# Patient Record
Sex: Male | Born: 1955 | Race: White | Hispanic: No | Marital: Married | Smoking: Never smoker
Health system: Southern US, Community
[De-identification: ages and names within clinical notes are randomized; demographics above are authoritative.]

## PROBLEM LIST (undated history)

## (undated) DIAGNOSIS — N400 Enlarged prostate without lower urinary tract symptoms: Secondary | ICD-10-CM

## (undated) DIAGNOSIS — E785 Hyperlipidemia, unspecified: Secondary | ICD-10-CM

## (undated) DIAGNOSIS — E669 Obesity, unspecified: Secondary | ICD-10-CM

## (undated) DIAGNOSIS — R972 Elevated prostate specific antigen [PSA]: Secondary | ICD-10-CM

## (undated) DIAGNOSIS — N401 Enlarged prostate with lower urinary tract symptoms: Secondary | ICD-10-CM

## (undated) HISTORY — DX: Hyperlipidemia, unspecified: E78.5

## (undated) HISTORY — PX: KIDNEY STONE SURGERY: SHX686

## (undated) HISTORY — DX: Obesity, unspecified: E66.9

## (undated) HISTORY — PX: CHOLECYSTECTOMY: SHX55

## (undated) HISTORY — PX: VASECTOMY: SHX75

## (undated) HISTORY — DX: Elevated prostate specific antigen (PSA): R97.20

## (undated) HISTORY — PX: TONSILLECTOMY: SHX5217

---

## 2010-08-11 ENCOUNTER — Ambulatory Visit
Admission: RE | Admit: 2010-08-11 | Discharge: 2010-08-11 | Payer: Self-pay | Source: Home / Self Care | Attending: Family Medicine | Admitting: Family Medicine

## 2010-08-11 ENCOUNTER — Encounter (INDEPENDENT_AMBULATORY_CARE_PROVIDER_SITE_OTHER): Payer: Self-pay | Admitting: *Deleted

## 2010-08-11 ENCOUNTER — Other Ambulatory Visit: Payer: Self-pay | Admitting: Family Medicine

## 2010-08-11 DIAGNOSIS — N400 Enlarged prostate without lower urinary tract symptoms: Secondary | ICD-10-CM | POA: Insufficient documentation

## 2010-08-11 DIAGNOSIS — N401 Enlarged prostate with lower urinary tract symptoms: Secondary | ICD-10-CM | POA: Insufficient documentation

## 2010-08-11 LAB — BASIC METABOLIC PANEL
BUN: 16 mg/dL (ref 6–23)
CO2: 29 mEq/L (ref 19–32)
Calcium: 9.4 mg/dL (ref 8.4–10.5)
Chloride: 102 mEq/L (ref 96–112)
Creatinine, Ser: 0.9 mg/dL (ref 0.4–1.5)
GFR: 99.13 mL/min (ref >60.00)
Glucose, Bld: 89 mg/dL (ref 70–99)
Potassium: 5.2 mEq/L — ABNORMAL HIGH (ref 3.5–5.1)
Sodium: 139 mEq/L (ref 135–145)

## 2010-08-11 LAB — LIPID PANEL
Cholesterol: 155 mg/dL (ref 0–200)
HDL: 43.6 mg/dL (ref >39.00)
LDL Cholesterol: 98 mg/dL (ref 0–99)
Total CHOL/HDL Ratio: 4
Triglycerides: 65 mg/dL (ref 0.0–149.0)
VLDL: 13 mg/dL (ref 0.0–40.0)

## 2010-08-11 LAB — PSA: PSA: 2.06 ng/mL (ref 0.10–4.00)

## 2010-08-27 ENCOUNTER — Encounter (INDEPENDENT_AMBULATORY_CARE_PROVIDER_SITE_OTHER): Payer: Self-pay | Admitting: *Deleted

## 2010-08-27 NOTE — Letter (Signed)
Summary: Pre Visit Letter Revised  [facility]  [address] [place], [place] [address]   Phone: [phone]  Fax: [phone]        08/11/2010 MRN: [id number] Riley Peterson [address] [place], [place]  [address]             Procedure Date:  September 14, 2010   dir col- Dr Jarold Motto  Welcome to the Gastroenterology Division at [facility].    You are scheduled to see a nurse for your pre-procedure visit on August 31, 2010 at 11:00am on the 3rd floor at [facility], [address]  We ask that you try to arrive at our office 15 minutes prior to your appointment time to allow for check-in.  Please take a minute to review the attached form.  If you answer "Yes" to one or more of the questions on the first page, we ask that you call the person listed at your earliest opportunity.  If you answer "No" to all of the questions, please complete the rest of the form and bring it to your appointment.    Your nurse visit will consist of discussing your medical and surgical history, your immediate family medical history, and your medications.   If you are unable to list all of your medications on the form, please bring the medication bottles to your appointment and we will list them.  We will need to be aware of both prescribed and over the counter drugs.  We will need to know exact dosage information as well.    Please be prepared to read and sign documents such as consent forms, a financial agreement, and acknowledgement forms.  If necessary, and with your consent, a friend or relative is welcome to sit-in on the nurse visit with you.  Please bring your insurance card so that we may make a copy of it.  If your insurance requires a referral to see a specialist, please bring your referral form from your primary care physician.  No co-pay is required for this nurse visit.     If you cannot keep your appointment, please call [phone] to cancel or reschedule  prior to your appointment date.  This allows [place] the opportunity to schedule an appointment for another patient in need of care.    Thank you for choosing [facility] for your medical needs.  We appreciate the opportunity to care for you.  Please visit [place] at our website  to learn more about our practice.  Sincerely, The Gastroenterology Division

## 2010-08-27 NOTE — Assessment & Plan Note (Signed)
Summary: NEW PT TO EST/CLE   Vital Signs:  Patient profile:   55 year old male Height:      73.50 inches Weight:      259.50 pounds BMI:     33.89 Temp:     98.3 degrees F oral Pulse rate:   80 / minute Pulse rhythm:   regular BP sitting:   120 / 80  (left arm) Cuff size:   large  Vitals Entered By: Linde Gillis CMA Duncan Dull) (August 11, 2010 9:50 AM) CC: new patient, establish care   History of Present Illness: 55 yo here to establish care.    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  Obesity- chronic struggle.  Was a member of [employer] in [place]. [place]. Lost 120 ouns on their program in 2008.  Since moving here, had gained quite a bit of his weight back but he is back on track.  Already has lost over 20 pounds since beginning of this month. Also loves to ride his bike.  H/o colon polyps- due for colonoscopy.  Not having any changes in his bowel habits or blood in his stool.  BPH- feels like his symptoms are progressing.  Having difficulty starting and maintaining his stream.  Also increased noctura.  No family history of prostate CA.  Preventive Screening-Counseling & Management  Alcohol-Tobacco     Smoking Status: never  Caffeine-Diet-Exercise     Does Patient Exercise: yes  Allergies (verified): 1)  ! Penicillin  Past History:  Past Medical History: Obesity- compulsive overeater  Past Surgical History: Cholecystectomy Tonsillectomy  Family History: Mom died of stroke in her 106s, DM  h/o colon polyps  Social History: Married to [employer] Never Smoked Alcohol use-yes Regular exercise-yes Pt is a Education officer, environmental Smoking Status:  never Does Patient Exercise:  yes  Review of Systems      See HPI General:  Denies malaise. Eyes:  Denies blurring. ENT:  Denies difficulty swallowing. CV:  Denies chest pain or discomfort. Resp:  Denies shortness of breath. GI:  Denies abdominal pain, bloody stools, and change in bowel habits. GU:   Complains of nocturia, urinary frequency, and urinary hesitancy; denies discharge, dysuria, and incontinence. Derm:  Denies rash. Neuro:  Denies headaches. Psych:  Denies anxiety and depression. Endo:  Denies cold intolerance and heat intolerance. Heme:  Denies abnormal bruising and bleeding.  Physical Exam  General:  alert and overweight-appearing.   Head:  normocephalic and atraumatic.   Eyes:  vision grossly intact and pupils equal.   Ears:  R ear normal and L ear normal.   Nose:  no external deformity.   Mouth:  good dentition.   Neck:  No deformities, masses, or tenderness noted. Lungs:  Normal respiratory effort, chest expands symmetrically. Lungs are clear to auscultation, no crackles or wheezes. Heart:  Normal rate and regular rhythm. S1 and S2 normal without gallop, murmur, click, rub or other extra sounds. Abdomen:  Bowel sounds positive,abdomen soft and non-tender without masses, organomegaly or hernias noted. Rectal:  No external abnormalities noted. Normal sphincter tone. No rectal masses or tenderness. Prostate:  no nodules, no asymmetry, and 1+ enlarged.   Msk:  No deformity or scoliosis noted of thoracic or lumbar spine.   Extremities:  No clubbing, cyanosis, edema, or deformity noted with normal full range of motion of all joints.   Neurologic:  No cranial nerve deficits noted. Station and gait are normal. Plantar reflexes are down-going bilaterally. DTRs are symmetrical throughout. Sensory,  motor and coordinative functions appear intact. Skin:  Intact without suspicious lesions or rashes Psych:  Cognition and judgment appear intact. Alert and cooperative with normal attention span and concentration. 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 He will think about it. Venipuncture (87564)  Problem # 2:  MORBID OBESITY (ICD-278.01) Assessment: Unchanged Time spent with patient 45 minutes, more than 50% of this time was spent discussing his issues with overeating and his eating plan. Has wonderful insight and very motivated to stay on track.   Other Orders: TLB-BMP (Basic Metabolic Panel-BMET) (80048-METABOL) TLB-Lipid Panel (80061-LIPID) TLB-PSA (Prostate Specific Antigen) (84153-PSA) Gastroenterology Referral (GI)  Patient Instructions: 1)  Great to meet you. 2)  Please stop by to see Shirlee Limerick on your way out.   Orders Added: 1)  Venipuncture [36415] 2)  TLB-BMP (Basic Metabolic Panel-BMET) [80048-METABOL] 3)  TLB-Lipid Panel [80061-LIPID] 4)  TLB-PSA (Prostate Specific Antigen) [33295-JOA] 5)  Gastroenterology Referral [GI] 6)  New Patient Level IV [41660]    Prior Medications (reviewed today): None Current Allergies (reviewed today): ! PENICILLIN  Colonoscopy Result Date:  08/11/2005 Colonoscopy Result:  historical-  Colonoscopy Next Due:  5 yr Hemoccult Result Date:  08/11/2010

## 2010-09-02 ENCOUNTER — Encounter: Payer: Self-pay | Admitting: Gastroenterology

## 2010-09-02 NOTE — Miscellaneous (Signed)
Summary: ROI  ROI   Imported By: Kassie Mends 08/28/2010 08:44:11  _____________________________________________________________________  External Attachment:    Type:   Image     Comment:   External Document

## 2010-09-08 ENCOUNTER — Encounter: Payer: Self-pay | Admitting: Family Medicine

## 2010-09-10 NOTE — Miscellaneous (Signed)
Summary: [facility] previsit...  Clinical Lists Changes  Medications: Added new medication of MOVIPREP 100 GM  SOLR (PEG-KCL-NACL-NASULF-NA ASC-C) As per prep instructions. - Signed Rx of MOVIPREP 100 GM  SOLR (PEG-KCL-NACL-NASULF-NA ASC-C) As per prep instructions.;  #1 x 0;  Signed;  Entered by: Karl Bales RN;  Authorized by: Mardella Layman MD [facility];  Method used: Print then Give to Patient    Prescriptions: MOVIPREP 100 GM  SOLR (PEG-KCL-NACL-NASULF-NA ASC-C) As per prep instructions.  #1 x 0   Entered by:   Karl Bales RN   Authorized by:   Mardella Layman MD [facility]   Signed by:   Karl Bales RN on 09/02/2010   Method used:   Print then Give to Patient   RxID:   [id number]

## 2010-09-10 NOTE — Letter (Signed)
Summary: Moviprep Instructions  [facility]  [address]   [place], [place] [address]   Phone: [phone]  Fax: [phone]       [name]    1955-09-19    MRN: [id number]        Procedure Day /Date: Monday, 09-14-10     Arrival Time: 9:30 a.m.     Procedure Time: 10:30 a.m.     Location of Procedure:                    x  [facility] (4th Floor)   PREPARATION FOR COLONOSCOPY WITH MOVIPREP   Starting 5 days prior to your procedure 09-09-10 do not eat nuts, seeds, popcorn, corn, beans, peas,  salads, or any raw vegetables.  Do not take any fiber supplements (e.g. Metamucil, Citrucel, and Benefiber).  THE DAY BEFORE YOUR PROCEDURE         DATE: 09-13-10  DAY: Sunday  1.  Drink clear liquids the entire day-NO SOLID FOOD  2.  Do not drink anything colored red or purple.  Avoid juices with pulp.  No orange juice.  3.  Drink at least 64 oz. (8 glasses) of fluid/clear liquids during the day to prevent dehydration and help the prep work efficiently.  CLEAR LIQUIDS INCLUDE: Water Jello Ice Popsicles Tea (sugar ok, no milk/cream) Powdered fruit flavored drinks Coffee (sugar ok, no milk/cream) Gatorade Juice: apple, white grape, white cranberry  Lemonade Clear bullion, consomm, broth Carbonated beverages (any kind) Strained chicken noodle soup Hard Candy                             4.  In the morning, mix first dose of MoviPrep solution:    Empty 1 Pouch A and 1 Pouch B into the disposable container    Add lukewarm drinking water to the top line of the container. Mix to dissolve    Refrigerate (mixed solution should be used within 24 hrs)  5.  Begin drinking the prep at 5:00 p.m. The MoviPrep container is divided by 4 marks.   Every 15 minutes drink the solution down to the next mark (approximately 8 oz) until the full liter is complete.   6.  Follow completed prep with 16 oz of clear liquid of your choice (Nothing red or purple).   Continue to drink clear liquids until bedtime.  7.  Before going to bed, mix second dose of MoviPrep solution:    Empty 1 Pouch A and 1 Pouch B into the disposable container    Add lukewarm drinking water to the top line of the container. Mix to dissolve    Refrigerate  THE DAY OF YOUR PROCEDURE      DATE: 09-14-10  DAY: Monday  Beginning at 5:30 a.m. (5 hours before procedure):         1. Every 15 minutes, drink the solution down to the next mark (approx 8 oz) until the full liter is complete.  2. Follow completed prep with 16 oz. of clear liquid of your choice.    3. You may drink clear liquids until  8:30 a.m. (2 HOURS BEFORE PROCEDURE).   MEDICATION INSTRUCTIONS  Unless otherwise instructed, you should take regular prescription medications with a small sip of water   as early as possible the morning of your procedure.         OTHER INSTRUCTIONS  You will need a responsible adult at  least 53 years of age to accompany you and drive you home.   This person must remain in the waiting room during your procedure.  Wear loose fitting clothing that is easily removed.  Leave jewelry and other valuables at home.  However, you may wish to bring a book to read or  an iPod/MP3 player to listen to music as you wait for your procedure to start.  Remove all body piercing jewelry and leave at home.  Total time from sign-in until discharge is approximately 2-3 hours.  You should go home directly after your procedure and rest.  You can resume normal activities the  day after your procedure.  The day of your procedure you should not:   Drive   Make legal decisions   Operate machinery   Drink alcohol   Return to work  You will receive specific instructions about eating, activities and medications before you leave.    The above instructions have been reviewed and explained to me by   Karl Bales RN  September 02, 2010 8:33 AM    I fully understand and can  verbalize these instructions _____________________________ Date _________

## 2010-09-14 ENCOUNTER — Encounter: Payer: Self-pay | Admitting: Gastroenterology

## 2010-09-14 ENCOUNTER — Other Ambulatory Visit (AMBULATORY_SURGERY_CENTER): Payer: BC Managed Care – PPO | Admitting: Gastroenterology

## 2010-09-14 DIAGNOSIS — Z86010 Personal history of colonic polyps: Secondary | ICD-10-CM

## 2010-09-14 DIAGNOSIS — Z1211 Encounter for screening for malignant neoplasm of colon: Secondary | ICD-10-CM

## 2010-09-14 LAB — HM COLONOSCOPY

## 2010-09-22 NOTE — Procedures (Signed)
Summary: Colonoscopy  Patient: Riley Peterson Note: All result statuses are Final unless otherwise noted.  Tests: (1) Colonoscopy (COL)   COL Colonoscopy           DONE     [facility]     [address]     [place], [place]  [address]           COLONOSCOPY PROCEDURE REPORT           PATIENT:  Riley Peterson, Riley Peterson  MR#:  [id number]     BIRTHDATE:  [birth year], 54 yrs. old  GENDER:  male     ENDOSCOPIST:  Vania Rea. Jarold Motto, MD, [facility]     REF. BY:  Ruthe Mannan, M.D.     PROCEDURE DATE:  09/14/2010     PROCEDURE:  Surveillance Colonoscopy     ASA CLASS:  Class I     INDICATIONS:  history of polyps HYPERPLASTIC POLYPS 5 YEARS     [place] AND [place]. [place].     MEDICATIONS:   Fentanyl 100 mcg IV, Versed 10 mg           DESCRIPTION OF PROCEDURE:   After the risks benefits and     alternatives of the procedure were thoroughly explained, informed     consent was obtained.  Digital rectal exam was performed and     revealed no abnormalities.   The LB 180AL [id number] endoscope was     introduced through the anus and advanced to the cecum, which was     identified by both the appendix and ileocecal valve, without     limitations.  The quality of the prep was excellent, using     MoviPrep.  The instrument was then slowly withdrawn as the colon     was fully examined.     <<PROCEDUREIMAGES>>           FINDINGS:  No polyps or cancers were seen.  This was otherwise a     normal examination of the colon.   Retroflexed views in the rectum     revealed no abnormalities.    The scope was then withdrawn from     the patient and the procedure completed.           COMPLICATIONS:  None     ENDOSCOPIC IMPRESSION:     1) No polyps or cancers     2) Otherwise normal examination     RECOMMENDATIONS:     1) Continue current colorectal screening recommendations for     "routine risk" patients with a repeat colonoscopy in 10 years.     REPEAT EXAM:  No        ______________________________     Vania Rea. Jarold Motto, MD, [facility]           CC:           n.     eSIGNED:   Vania Rea. Rahsaan Weakland at 09/14/2010 10:50 AM           Carola Frost, [id number]  Note: An exclamation mark (!) indicates a result that was not dispersed into the flowsheet. Document Creation Date: 09/14/2010 10:51 AM _______________________________________________________________________  (1) Order result status: Final Collection or observation date-time: 09/14/2010 10:45 Requested date-time:  Receipt date-time:  Reported date-time:  Referring Physician:   Ordering Physician: Sheryn Bison [phone]) Specimen Source:  Source: Launa Grill Order Number: [id number] Lab site:   Appended Document: Colonoscopy    Clinical Lists Changes  Observations: Added new observation  of COLONNXTDUE: 08/2020 (09/14/2010 11:05)

## 2010-10-06 NOTE — Letter (Signed)
Summary: Riley Peterson   Imported By: Kassie Mends 09/28/2010 09:46:49  _____________________________________________________________________  External Attachment:    Type:   Image     Comment:   External Document

## 2011-09-16 ENCOUNTER — Encounter: Payer: Self-pay | Admitting: Family Medicine

## 2011-09-16 ENCOUNTER — Telehealth: Payer: Self-pay | Admitting: Family Medicine

## 2011-09-16 NOTE — Telephone Encounter (Signed)
Triage Record Num: [id number] Operator: Jeraldine Loots Patient Name: Riley Peterson Call Date & Time: 09/16/2011 2:51:00PM Patient Phone: [phone] PCP: Ruthe Mannan Patient Gender: Male PCP Fax : [phone] Patient DOB: [birth year] Practice Name: [facility] Reason for Call: Caller: [name]/Patient; PCP: Ruthe Mannan [facility]); CB#: [phone]. Got up during the night to urinate and passed out. He states that his urine flow is very slow during the night and he was urinating and then woke up on the bathroom floor. No chest pain or shortness of breath. Has been dieting but pushing the fluids. No head trauma or soreness. Scheduled for Friday at 215 wtih Dr. Alphonsus Sias. He is going to go this afternoon and have his b/p checked. Protocol(s) Used: Fainting Recommended Outcome per Protocol: See Provider within 4 hours Reason for Outcome: Fainting episode without any warning symptoms Care Advice: ~ IMMEDIATE ACTION 02/

## 2011-09-17 ENCOUNTER — Ambulatory Visit (INDEPENDENT_AMBULATORY_CARE_PROVIDER_SITE_OTHER): Payer: BC Managed Care – PPO | Admitting: Internal Medicine

## 2011-09-17 ENCOUNTER — Encounter: Payer: Self-pay | Admitting: Internal Medicine

## 2011-09-17 VITALS — BP 120/80 | HR 68 | Temp 98.3°F | Wt 258.0 lb

## 2011-09-17 DIAGNOSIS — R55 Syncope and collapse: Secondary | ICD-10-CM

## 2011-09-17 NOTE — Assessment & Plan Note (Signed)
Fairly classic story of vasovagal syncope No prior symptoms and no cardiovascular findings EKG is normal Discussed sitting on cammode at night  Will work more on fitness Reassured overall

## 2011-09-17 NOTE — Progress Notes (Signed)
  Subjective:    Patient ID: Riley Peterson, male    DOB: Jul 07, 1956, 56 y.o.   MRN: [id number]  HPI Fairly frequent nocturia--some variablility 2 nights ago--he was standing trying to void---very slow stream Got abnormal sensation and then was on floor Wife checked on him Able to just go back to bed  No chest pain No SOB Felt fine the next day Did not really hurt anything with the fall No palpitations  Walks dog daily Occ lifts weights or does zumba No change in exercise tolerance (other than feet pain)--did see Dr Ether Griffins at [facility]  No current outpatient prescriptions on file prior to visit.    Allergies  Allergen Reactions  . Penicillins     Past Medical History  Diagnosis Date  . Obesity     compulsive overeater    Past Surgical History  Procedure Date  . Cholecystectomy   . Tonsillectomy     No family history on file.  History   Social History  . Marital Status: Married    Spouse Name: N/A    Number of Children: N/A  . Years of Education: N/A   Occupational History  . pastor    Social History Main Topics  . Smoking status: Never Smoker   . Smokeless tobacco: Never Used  . Alcohol Use: Yes  . Drug Use: No  . Sexually Active: Not on file   Other Topics Concern  . 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 No distress.  Neck: Normal range of motion. Neck supple.  Cardiovascular: Normal rate, regular rhythm, normal heart sounds and intact distal pulses.  Exam reveals no gallop.   No murmur heard. Pulmonary/Chest: Effort normal and breath sounds normal. No respiratory distress. He has no wheezes. He has no rales.  Abdominal: Soft. There is no tenderness.  Musculoskeletal: He exhibits no edema.  Lymphadenopathy:    He has no cervical adenopathy.  Psychiatric: He has a  normal mood and affect. His behavior is normal. Judgment and thought content normal.          Assessment & Plan:

## 2011-09-17 NOTE — Patient Instructions (Signed)
Cancel upcoming appt

## 2011-09-19 NOTE — Telephone Encounter (Signed)
Pt was evaluated by [facility]

## 2011-09-21 ENCOUNTER — Ambulatory Visit: Payer: BC Managed Care – PPO | Admitting: Family Medicine

## 2011-10-13 ENCOUNTER — Ambulatory Visit: Payer: BC Managed Care – PPO | Admitting: Family Medicine

## 2011-11-02 ENCOUNTER — Encounter: Payer: Self-pay | Admitting: Family Medicine

## 2011-11-02 ENCOUNTER — Ambulatory Visit (INDEPENDENT_AMBULATORY_CARE_PROVIDER_SITE_OTHER): Payer: BC Managed Care – PPO | Admitting: Family Medicine

## 2011-11-02 VITALS — BP 140/80 | HR 84 | Temp 97.9°F | Ht 72.75 in | Wt 254.0 lb

## 2011-11-02 DIAGNOSIS — Z0000 Encounter for general adult medical examination without abnormal findings: Secondary | ICD-10-CM

## 2011-11-02 DIAGNOSIS — R3911 Hesitancy of micturition: Secondary | ICD-10-CM

## 2011-11-02 DIAGNOSIS — Z23 Encounter for immunization: Secondary | ICD-10-CM

## 2011-11-02 DIAGNOSIS — Z136 Encounter for screening for cardiovascular disorders: Secondary | ICD-10-CM

## 2011-11-02 LAB — COMPREHENSIVE METABOLIC PANEL WITH GFR
ALT: 22 U/L (ref 0–53)
AST: 19 U/L (ref 0–37)
Albumin: 4.1 g/dL (ref 3.5–5.2)
Alkaline Phosphatase: 56 U/L (ref 39–117)
BUN: 19 mg/dL (ref 6–23)
CO2: 26 meq/L (ref 19–32)
Calcium: 9.1 mg/dL (ref 8.4–10.5)
Chloride: 105 meq/L (ref 96–112)
Creatinine, Ser: 0.7 mg/dL (ref 0.4–1.5)
GFR: 129.38 mL/min
Glucose, Bld: 85 mg/dL (ref 70–99)
Potassium: 4.5 meq/L (ref 3.5–5.1)
Sodium: 139 meq/L (ref 135–145)
Total Bilirubin: 0.5 mg/dL (ref 0.3–1.2)
Total Protein: 6.3 g/dL (ref 6.0–8.3)

## 2011-11-02 LAB — LIPID PANEL
Cholesterol: 153 mg/dL (ref 0–200)
HDL: 40.8 mg/dL
LDL Cholesterol: 100 mg/dL — ABNORMAL HIGH (ref 0–99)
Total CHOL/HDL Ratio: 4
Triglycerides: 63 mg/dL (ref 0.0–149.0)
VLDL: 12.6 mg/dL (ref 0.0–40.0)

## 2011-11-02 LAB — PSA: PSA: 1.81 ng/mL (ref 0.10–4.00)

## 2011-11-02 LAB — VITAMIN B12: Vitamin B-12: 311 pg/mL (ref 211–911)

## 2011-11-02 MED ORDER — FLUOCINONIDE-E 0.05 % EX CREA: TOPICAL_CREAM | Freq: Two times a day (BID) | CUTANEOUS | Status: DC

## 2011-11-02 NOTE — Progress Notes (Signed)
56 yo here to for CPX.   Syncope- one episode of syncope in February.  Saw Dr. Alphonsus Sias- EKG reassuring--likely vasovagal.  Obesity- chronic struggle. Rejoined a [employer] group here which is making him feel better.  Plans to ride his bike more now that the weather is nicer.  Lab Results  Component Value Date   CHOL 155 08/11/2010   HDL 43.60 08/11/2010   LDLCALC 98 08/11/2010   TRIG 65.0 08/11/2010   CHOLHDL 4 08/11/2010      BPH- feels like his symptoms are progressing. Having difficulty starting and maintaining his stream. Also increased noctura. No family history of prostate CA.  Lab Results  Component Value Date   PSA 2.06 08/11/2010    Patient Active Problem List  Diagnoses  . MORBID OBESITY  . BENIGN PROSTATIC HYPERTROPHY, WITH URINARY OBSTRUCTION  . Syncope  . Routine general medical examination at a health care facility   Past Medical History  Diagnosis Date  . Obesity     compulsive overeater   Past Surgical History  Procedure Date  . Cholecystectomy   . Tonsillectomy    History  Substance Use Topics  . Smoking status: Never Smoker   . Smokeless tobacco: Never Used  . Alcohol Use: Yes   No family history on file. Allergies  Allergen Reactions  . Penicillins    No current outpatient prescriptions on file prior to visit.   The PMH, PSH, Social History, Family History, Medications, and allergies have been reviewed in [facility], and have been updated if relevant.  ROS: See HPI Patient reports no  vision/ hearing changes,anorexia, weight change, fever ,adenopathy, persistant / recurrent hoarseness, swallowing issues, chest pain, edema,persistant / recurrent cough, hemoptysis, dyspnea(rest, exertional, paroxysmal nocturnal), gastrointestinal  bleeding (melena, rectal bleeding), abdominal pain, excessive heart burn, GU symptoms(dysuria, hematuria, pyuria, voiding/incontinence  Issues) syncope, focal weakness, severe memory loss, concerning skin  lesions, depression, anxiety, abnormal bruising/bleeding, major joint swelling.    Physical exam: BP 140/80  Pulse 84  Temp(Src) 97.9 F (36.6 C) (Oral)  Ht 6' 0.75" (1.848 m)  Wt 254 lb (115.214 kg)  BMI 33.74 kg/m2 BP Readings from Last 3 Encounters:  11/02/11 140/80  09/17/11 120/80  08/11/10 120/80    Wt Readings from Last 3 Encounters:  11/02/11 254 lb (115.214 kg)  09/17/11 258 lb (117.028 kg)  08/11/10 259 lb 8 oz (117.708 kg)    General:  overweght male in NAD Eyes:  PERRL Ears:  External ear exam shows no significant lesions or deformities.  Otoscopic examination reveals clear canals, tympanic membranes are intact bilaterally without bulging, retraction, inflammation or discharge. Hearing is grossly normal bilaterally. Nose:  External nasal examination shows no deformity or inflammation. Nasal mucosa are pink and moist without lesions or exudates. Mouth:  Oral mucosa and oropharynx without lesions or exudates.  Teeth in good repair. Neck:  no carotid bruit or thyromegaly no cervical or supraclavicular lymphadenopathy  Lungs:  Normal respiratory effort, chest expands symmetrically. Lungs are clear to auscultation, no crackles or wheezes. Heart:  Normal rate and regular rhythm. S1 and S2 normal without gallop, murmur, click, rub or other extra sounds. Abdomen:  Bowel sounds positive,abdomen soft and non-tender without masses, organomegaly or hernias noted. Genitalia:  Testes bilaterally descended without nodularity, tenderness or masses. No scrotal masses or lesions. No penis lesions or urethral discharge. Prostate:  Prostate gland firm and smooth, 1 plus enlargement, no nodularity, tenderness, mass, asymmetry or induration. Pulses:  R and L posterior tibial pulses  are full and equal bilaterally  Extremities:  no edema   Assessment and Plan: 1. Routine general medical examination at a health care facility  Comprehensive metabolic panel, Vitamin B12 Lipid Panel PSA Tdap  vaccine greater than or equal to 7yo IM   Reviewed preventive care protocols, scheduled due services, and updated immunizations Discussed nutrition, exercise, diet, and healthy lifestyle.

## 2011-11-02 NOTE — Patient Instructions (Signed)
Great to see you. Try Eucerin over the counter for dry skin. You may also use the prescription lidex for the itchy or irritated areas. We will call you with your lab results.

## 2011-11-10 ENCOUNTER — Encounter: Payer: Self-pay | Admitting: *Deleted

## 2012-02-14 ENCOUNTER — Ambulatory Visit (INDEPENDENT_AMBULATORY_CARE_PROVIDER_SITE_OTHER): Payer: BC Managed Care – PPO | Admitting: Family Medicine

## 2012-02-14 ENCOUNTER — Encounter: Payer: Self-pay | Admitting: Family Medicine

## 2012-02-14 VITALS — BP 112/74 | HR 74 | Temp 98.2°F | Wt 243.0 lb

## 2012-02-14 DIAGNOSIS — H6120 Impacted cerumen, unspecified ear: Secondary | ICD-10-CM

## 2012-02-14 NOTE — Patient Instructions (Addendum)
Take care.  Enjoy camp.

## 2012-02-14 NOTE — Progress Notes (Signed)
Pt thought he had swimmer's ear and cerumen impaction in B ears.  Ears last felt normal several weeks ago, worse over last 2 weeks.  No FCNV. Hearing is affected in B ears.  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   Meds, vitals, and allergies reviewed.   ROS: See HPI.  Otherwise, noncontributory.  Nad ncat B cerumen impaction resolved with irrigation and recheck canals wnl x2, no erythema on TMs, pinna wnl x2.  Tolerated well w/o complication

## 2012-02-14 NOTE — Assessment & Plan Note (Signed)
Bilateral, resolved.  F/u prn.  No sign on AOM or AOE.

## 2012-03-28 ENCOUNTER — Ambulatory Visit (INDEPENDENT_AMBULATORY_CARE_PROVIDER_SITE_OTHER): Payer: BC Managed Care – PPO | Admitting: Family Medicine

## 2012-03-28 ENCOUNTER — Encounter: Payer: Self-pay | Admitting: Family Medicine

## 2012-03-28 VITALS — BP 124/80 | HR 76 | Temp 98.4°F | Wt 252.0 lb

## 2012-03-28 DIAGNOSIS — R21 Rash and other nonspecific skin eruption: Secondary | ICD-10-CM

## 2012-03-28 MED ORDER — DOXYCYCLINE HYCLATE 100 MG PO TABS: 100.0000 mg | ORAL_TABLET | Freq: Two times a day (BID) | ORAL | Status: AC

## 2012-03-28 NOTE — Progress Notes (Signed)
  Subjective:    Patient ID: Riley Peterson, male    DOB: 03/24/1956, 56 y.o.   MRN: [id number]  Rash This is a new problem. The current episode started in the past 7 days (5 days ago when on vacation). The affected locations include the head (behind left ear). The rash is characterized by itchiness, redness and burning (bumps, appeared like pimple, now starting to get  mild discomfort, sensitivity). He was exposed to an ill contact (goes into a lot of hospitals.. pt on a lot of antibiotics, grandaughter with ). Pertinent negatives include no congestion, cough, eye pain, fatigue, fever, shortness of breath or sore throat. Past treatments include nothing. His past medical history is significant for varicella. There is no history of eczema. (Grandchild had scabies on 8/19)      Review of Systems  Constitutional: Negative for fever and fatigue.  HENT: Negative for congestion and sore throat.   Eyes: Negative for pain.  Respiratory: Negative for cough and shortness of breath.   Skin: Positive for rash.       Objective:   Physical Exam  Constitutional: He appears well-developed and well-nourished.  HENT:  Head: Normocephalic.  Right Ear: External ear normal.  Left Ear: External ear normal.  Nose: Nose normal.  Mouth/Throat: Oropharynx is clear and moist.  Eyes: Conjunctivae are normal. Pupils are equal, round, and reactive to light.  Neck: Normal range of motion. Neck supple.  Cardiovascular: Normal rate and regular rhythm.   Pulmonary/Chest: Effort normal and breath sounds normal.  Skin: Skin is warm. Rash noted. There is erythema.       4 pustules with surrounding erythema on left neck, rash nontender and no hypersensitivity of skin,   3 tender lymph nodes in left posterior cervical chain (these are what is causing mild discomfort for pt)          Assessment & Plan:

## 2012-03-28 NOTE — Patient Instructions (Addendum)
Keep area covered. Wash hands.  Start antibiotic for presumed bacterial infection, Call if redness spreading, fever on antibiotics or if severe pain.

## 2012-03-28 NOTE — Assessment & Plan Note (Signed)
Most consistent with bacterial infection given pustules and lymphadenopathy.  Not clearly shingles or scabies. 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 10 day course of doxycycline. Follow up in 1 week. Call sooner if symptoms changing or rash spreading.

## 2012-04-07 ENCOUNTER — Ambulatory Visit (INDEPENDENT_AMBULATORY_CARE_PROVIDER_SITE_OTHER): Payer: BC Managed Care – PPO | Admitting: Family Medicine

## 2012-04-07 ENCOUNTER — Encounter: Payer: Self-pay | Admitting: Family Medicine

## 2012-04-07 VITALS — BP 110/80 | HR 77 | Ht 72.75 in | Wt 236.0 lb

## 2012-04-07 DIAGNOSIS — R21 Rash and other nonspecific skin eruption: Secondary | ICD-10-CM

## 2012-04-07 NOTE — Assessment & Plan Note (Signed)
Likely bacterial infection.Marland Kitchen Resolved lymphadenopathy with antibiotics. No further treatment necessary.

## 2012-04-07 NOTE — Progress Notes (Signed)
  Subjective:    Patient ID: Riley Peterson, male    DOB: Aug 22, 1955, 56 y.o.   MRN: [id number]  HPI 56 year old male presents for follow up neck rash seen initially 10 days ago as follows:  Rash of neck - Kerby Nora, MD 03/28/2012 10:06 AM Signed  Most consistent with bacterial infection given pustules and lymphadenopathy.  Not clearly shingles or scabies.  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  10 day course of doxycycline. Follow up in 1 week.  Call sooner if symptoms changing or rash spreading.  Today he reports no pain, no fever, swollen area resolved, area scabbed over. Tolerated all antibiotics on last dose today.    Review of Systems  Constitutional: Negative for fever and fatigue.  Respiratory: Negative for shortness of breath.   Cardiovascular: Negative for chest pain.       Objective:   Physical Exam  Constitutional: He appears well-developed and well-nourished.  Cardiovascular: Normal rate and regular rhythm.   Pulmonary/Chest: Effort normal and breath sounds normal.  Skin:       Scabbed lesions x 3 on left posterior neck, resolved lymphadenopathy          Assessment & Plan:

## 2012-11-01 ENCOUNTER — Ambulatory Visit (INDEPENDENT_AMBULATORY_CARE_PROVIDER_SITE_OTHER): Payer: BC Managed Care – PPO | Admitting: Family Medicine

## 2012-11-01 ENCOUNTER — Encounter: Payer: Self-pay | Admitting: Family Medicine

## 2012-11-01 VITALS — BP 120/84 | HR 78 | Temp 98.0°F | Ht 72.25 in | Wt 274.0 lb

## 2012-11-01 DIAGNOSIS — L84 Corns and callosities: Secondary | ICD-10-CM

## 2012-11-01 NOTE — Patient Instructions (Addendum)
Good to see you, Mr. Nouri I would try Dr. Al Corpus at [facility]- [phone]  After you see the podiatrist, make an appointment with Dr. Patsy Lager for orthotics.
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
## 2014-02-01 ENCOUNTER — Other Ambulatory Visit (INDEPENDENT_AMBULATORY_CARE_PROVIDER_SITE_OTHER): Payer: BC Managed Care – PPO

## 2014-02-01 DIAGNOSIS — Z125 Encounter for screening for malignant neoplasm of prostate: Secondary | ICD-10-CM

## 2014-02-01 DIAGNOSIS — Z0000 Encounter for general adult medical examination without abnormal findings: Secondary | ICD-10-CM

## 2014-02-01 LAB — CBC WITH DIFFERENTIAL/PLATELET
Basophils Absolute: 0 10*3/uL (ref 0.0–0.1)
Basophils Relative: 0.5 % (ref 0.0–3.0)
EOS PCT: 3.4 % (ref 0.0–5.0)
Eosinophils Absolute: 0.2 10*3/uL (ref 0.0–0.7)
HEMATOCRIT: 42.9 % (ref 39.0–52.0)
HEMOGLOBIN: 14.6 g/dL (ref 13.0–17.0)
LYMPHS ABS: 1.8 10*3/uL (ref 0.7–4.0)
LYMPHS PCT: 32.7 % (ref 12.0–46.0)
MCHC: 34 g/dL (ref 30.0–36.0)
MCV: 84.8 fl (ref 78.0–100.0)
MONOS PCT: 10.7 % (ref 3.0–12.0)
Monocytes Absolute: 0.6 10*3/uL (ref 0.1–1.0)
Neutro Abs: 2.9 10*3/uL (ref 1.4–7.7)
Neutrophils Relative %: 52.7 % (ref 43.0–77.0)
PLATELETS: 269 10*3/uL (ref 150.0–400.0)
RBC: 5.05 Mil/uL (ref 4.22–5.81)
RDW: 13.6 % (ref 11.5–15.5)
WBC: 5.5 10*3/uL (ref 4.0–10.5)

## 2014-02-01 LAB — COMPREHENSIVE METABOLIC PANEL
ALK PHOS: 69 U/L (ref 39–117)
ALT: 56 U/L — ABNORMAL HIGH (ref 0–53)
AST: 42 U/L — AB (ref 0–37)
Albumin: 4.3 g/dL (ref 3.5–5.2)
BUN: 18 mg/dL (ref 6–23)
CO2: 25 mEq/L (ref 19–32)
CREATININE: 0.8 mg/dL (ref 0.4–1.5)
Calcium: 9.3 mg/dL (ref 8.4–10.5)
Chloride: 101 mEq/L (ref 96–112)
GFR: 104.84 mL/min (ref >60.00)
Glucose, Bld: 94 mg/dL (ref 70–99)
Potassium: 4.5 mEq/L (ref 3.5–5.1)
Sodium: 136 mEq/L (ref 135–145)
Total Bilirubin: 1.4 mg/dL — ABNORMAL HIGH (ref 0.2–1.2)
Total Protein: 6.9 g/dL (ref 6.0–8.3)

## 2014-02-01 LAB — LIPID PANEL
CHOL/HDL RATIO: 5
Cholesterol: 225 mg/dL — ABNORMAL HIGH (ref 0–200)
HDL: 49.8 mg/dL (ref >39.00)
LDL CALC: 152 mg/dL — AB (ref 0–99)
NonHDL: 175.2
TRIGLYCERIDES: 115 mg/dL (ref 0.0–149.0)
VLDL: 23 mg/dL (ref 0.0–40.0)

## 2014-02-01 LAB — TSH: TSH: 4.22 u[IU]/mL (ref 0.35–4.50)

## 2014-02-01 LAB — PSA: PSA: 2.86 ng/mL (ref 0.10–4.00)

## 2014-02-05 DIAGNOSIS — E785 Hyperlipidemia, unspecified: Secondary | ICD-10-CM | POA: Insufficient documentation

## 2014-02-05 DIAGNOSIS — R7989 Other specified abnormal findings of blood chemistry: Secondary | ICD-10-CM | POA: Insufficient documentation

## 2014-02-05 DIAGNOSIS — R945 Abnormal results of liver function studies: Secondary | ICD-10-CM

## 2014-02-06 ENCOUNTER — Encounter: Payer: Self-pay | Admitting: Family Medicine

## 2014-02-06 ENCOUNTER — Ambulatory Visit (INDEPENDENT_AMBULATORY_CARE_PROVIDER_SITE_OTHER): Payer: BC Managed Care – PPO | Admitting: Family Medicine

## 2014-02-06 VITALS — BP 116/70 | HR 78 | Temp 98.0°F | Ht 72.75 in | Wt 276.2 lb

## 2014-02-06 DIAGNOSIS — R945 Abnormal results of liver function studies: Secondary | ICD-10-CM

## 2014-02-06 DIAGNOSIS — Z0000 Encounter for general adult medical examination without abnormal findings: Secondary | ICD-10-CM

## 2014-02-06 DIAGNOSIS — E785 Hyperlipidemia, unspecified: Secondary | ICD-10-CM

## 2014-02-06 DIAGNOSIS — R7989 Other specified abnormal findings of blood chemistry: Secondary | ICD-10-CM

## 2014-02-06 NOTE — Assessment & Plan Note (Signed)
Reviewed preventive care protocols, scheduled due services, and updated immunizations Discussed nutrition, exercise, diet, and healthy lifestyle.  

## 2014-02-06 NOTE — Assessment & Plan Note (Signed)
Deteriorated. We had a long talk about this today. He is very motivated to lose weight. Discussed weight loss plan. He is declining referral to nutritionist and declines appetite suppressant at this time. Follow up in 3 months.

## 2014-02-06 NOTE — Assessment & Plan Note (Signed)
?   Some fatty liver. Hopefully this will improve as well with weight loss. Recheck LFTs in 3 months. The patient indicates understanding of these issues and agrees with the plan.

## 2014-02-06 NOTE — Progress Notes (Signed)
Pre visit review using our clinic review tool, if applicable. No additional management support is needed unless otherwise documented below in the visit note. 

## 2014-02-06 NOTE — Assessment & Plan Note (Signed)
Deteriorated. He would like to try diet first. Follow up labs in 3 months.

## 2014-02-06 NOTE — Progress Notes (Signed)
58 yo pleasant male here to for CPX.  Colonscopy 2/20/12Sharlett Iles- 10 year recall. Tdap 11/02/2011  Obesity- chronic struggle. The compulsive eaters annonymous group is no longer together.  He did find a group online that he can call in for meetings 3 times a week.   Wt Readings from Last 3 Encounters:  02/06/14 276 lb 4 oz (125.306 kg)  11/01/12 274 lb (124.286 kg)  04/07/12 236 lb (107.049 kg)   Elevated LFTs-  Does not drink ETOH, not taking NSAIDs or Tylenol.   Lab Results  Component Value Date   ALT 56* 02/01/2014   AST 42* 02/01/2014   ALKPHOS 69 02/01/2014   BILITOT 1.4* 02/01/2014    HLD- deteriorated. Lab Results  Component Value Date   CHOL 225* 02/01/2014   HDL 49.80 02/01/2014   LDLCALC 152* 02/01/2014   TRIG 115.0 02/01/2014   CHOLHDL 5 02/01/2014     BPH- feels like his symptoms are stable.  Lab Results  Component Value Date   PSA 2.86 02/01/2014   PSA 1.81 11/02/2011   PSA 2.06 08/11/2010    Patient Active Problem List   Diagnosis Date Noted  . HLD (hyperlipidemia) 02/05/2014  . Elevated LFTs 02/05/2014  . Corns and callus 11/01/2012  . Routine general medical examination at a health care facility 11/02/2011  . Syncope 09/17/2011  . MORBID OBESITY 08/11/2010  . BENIGN PROSTATIC HYPERTROPHY, WITH URINARY OBSTRUCTION 08/11/2010   Past Medical History  Diagnosis Date  . Obesity     compulsive overeater   Past Surgical History  Procedure Laterality Date  . Cholecystectomy    . Tonsillectomy     History  Substance Use Topics  . Smoking status: Never Smoker   . Smokeless tobacco: Never Used  . Alcohol Use: Yes   No family history on file. Allergies  Allergen Reactions  . Penicillins    No current outpatient prescriptions on file prior to visit.   No current facility-administered medications on file prior to visit.   The PMH, PSH, Social History, Family History, Medications, and allergies have been reviewed in [facility], and have been updated if  relevant.  ROS: See HPI Patient reports no  vision/ hearing changes,anorexia, weight change, fever ,adenopathy, persistant / recurrent hoarseness, swallowing issues, chest pain, edema,persistant / recurrent cough, hemoptysis, dyspnea(rest, exertional, paroxysmal nocturnal), gastrointestinal  bleeding (melena, rectal bleeding), abdominal pain, excessive heart burn, GU symptoms(dysuria, hematuria, pyuria, voiding/incontinence  Issues) syncope, focal weakness, severe memory loss, concerning skin lesions, depression, anxiety, abnormal bruising/bleeding, major joint swelling.    Physical exam: BP 116/70  Pulse 78  Temp(Src) 98 F (36.7 C) (Oral)  Ht 6' 0.75" (1.848 m)  Wt 276 lb 4 oz (125.306 kg)  BMI 36.69 kg/m2  SpO2 98% BP Readings from Last 3 Encounters:  02/06/14 116/70  11/01/12 120/84  04/07/12 110/80    Wt Readings from Last 3 Encounters:  02/06/14 276 lb 4 oz (125.306 kg)  11/01/12 274 lb (124.286 kg)  04/07/12 236 lb (107.049 kg)    General:  overweght male in NAD Eyes:  PERRL Ears:  External ear exam shows no significant lesions or deformities.  Otoscopic examination reveals clear canals, tympanic membranes are intact bilaterally without bulging, retraction, inflammation or discharge. Hearing is grossly normal bilaterally. Nose:  External nasal examination shows no deformity or inflammation. Nasal mucosa are pink and moist without lesions or exudates. Mouth:  Oral mucosa and oropharynx without lesions or exudates.  Teeth in good repair. Neck:  no carotid bruit  or thyromegaly no cervical or supraclavicular lymphadenopathy  Lungs:  Normal respiratory effort, chest expands symmetrically. Lungs are clear to auscultation, no crackles or wheezes. Heart:  Normal rate and regular rhythm. S1 and S2 normal without gallop, murmur, click, rub or other extra sounds. Abdomen:  Bowel sounds positive,abdomen soft and non-tender without masses, organomegaly or hernias noted. Pulses:  R and L  posterior tibial pulses are full and equal bilaterally  Extremities:  no edema

## 2014-05-16 ENCOUNTER — Ambulatory Visit: Payer: BC Managed Care – PPO | Admitting: Family Medicine

## 2014-05-20 ENCOUNTER — Other Ambulatory Visit: Payer: Self-pay | Admitting: Family Medicine

## 2014-05-20 DIAGNOSIS — E785 Hyperlipidemia, unspecified: Secondary | ICD-10-CM

## 2014-05-21 ENCOUNTER — Other Ambulatory Visit (INDEPENDENT_AMBULATORY_CARE_PROVIDER_SITE_OTHER): Payer: BC Managed Care – PPO

## 2014-05-21 DIAGNOSIS — E785 Hyperlipidemia, unspecified: Secondary | ICD-10-CM

## 2014-05-21 LAB — HEPATIC FUNCTION PANEL
ALK PHOS: 61 U/L (ref 39–117)
ALT: 13 U/L (ref 0–53)
AST: 16 U/L (ref 0–37)
Albumin: 3.6 g/dL (ref 3.5–5.2)
BILIRUBIN TOTAL: 0.9 mg/dL (ref 0.2–1.2)
Bilirubin, Direct: 0.1 mg/dL (ref 0.0–0.3)
Total Protein: 6.6 g/dL (ref 6.0–8.3)

## 2014-05-22 ENCOUNTER — Encounter: Payer: Self-pay | Admitting: Family Medicine

## 2014-05-22 ENCOUNTER — Ambulatory Visit (INDEPENDENT_AMBULATORY_CARE_PROVIDER_SITE_OTHER): Payer: BC Managed Care – PPO | Admitting: Family Medicine

## 2014-05-22 DIAGNOSIS — R7989 Other specified abnormal findings of blood chemistry: Secondary | ICD-10-CM

## 2014-05-22 DIAGNOSIS — M7062 Trochanteric bursitis, left hip: Secondary | ICD-10-CM | POA: Insufficient documentation

## 2014-05-22 DIAGNOSIS — R945 Abnormal results of liver function studies: Secondary | ICD-10-CM

## 2014-05-22 DIAGNOSIS — R399 Unspecified symptoms and signs involving the genitourinary system: Secondary | ICD-10-CM | POA: Insufficient documentation

## 2014-05-22 MED ORDER — TAMSULOSIN HCL 0.4 MG PO CAPS: 0.4000 mg | ORAL_CAPSULE | Freq: Every day | ORAL | Status: DC

## 2014-05-22 NOTE — Progress Notes (Signed)
Pre visit review using our clinic review tool, if applicable. No additional management support is needed unless otherwise documented below in the visit note. 

## 2014-05-22 NOTE — Assessment & Plan Note (Signed)
New- discussed tx and course of bursitis. Given exercises from sports medicine advisor. Discussed using NSAIDs for short period of time, prn, with food. If symptoms persist, he could consider steroid injection. Call or return to clinic prn if these symptoms worsen or fail to improve as anticipated. The patient indicates understanding of these issues and agrees with the plan.

## 2014-05-22 NOTE — Patient Instructions (Signed)
Good to see you. You look fantastic.  Let me know how the flomax works.

## 2014-05-22 NOTE — Assessment & Plan Note (Signed)
Deteriorated. [place] [facility] referral. rx given for flomax- he would like to think about it before trying it again. Did warn about symptoms of hypotension since BP running lower now that he has lost weight. Call or return to clinic prn if these symptoms worsen or fail to improve as anticipated. The patient indicates understanding of these issues and agrees with the plan.

## 2014-05-22 NOTE — Assessment & Plan Note (Signed)
Improving.  BMI decreased to 30.75 from 36.69. He is very motivated to continue.

## 2014-05-22 NOTE — Assessment & Plan Note (Signed)
Resolved. ?fatty liver.  Will keep an eye on his liver function yearly. The patient indicates understanding of these issues and agrees with the plan.
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
## 2015-03-03 ENCOUNTER — Ambulatory Visit (INDEPENDENT_AMBULATORY_CARE_PROVIDER_SITE_OTHER): Payer: BLUE CROSS/BLUE SHIELD | Admitting: Internal Medicine

## 2015-03-03 ENCOUNTER — Encounter: Payer: Self-pay | Admitting: Internal Medicine

## 2015-03-03 ENCOUNTER — Ambulatory Visit (INDEPENDENT_AMBULATORY_CARE_PROVIDER_SITE_OTHER)
Admission: RE | Admit: 2015-03-03 | Discharge: 2015-03-03 | Disposition: A | Discharge status: Home / Self Care | Payer: BLUE CROSS/BLUE SHIELD | Source: Ambulatory Visit | Attending: Internal Medicine | Admitting: Internal Medicine

## 2015-03-03 VITALS — BP 120/72 | HR 73 | Temp 98.2°F | Wt 254.0 lb

## 2015-03-03 DIAGNOSIS — S40212A Abrasion of left shoulder, initial encounter: Secondary | ICD-10-CM | POA: Diagnosis not present

## 2015-03-03 DIAGNOSIS — S50312A Abrasion of left elbow, initial encounter: Secondary | ICD-10-CM | POA: Diagnosis not present

## 2015-03-03 DIAGNOSIS — M25512 Pain in left shoulder: Secondary | ICD-10-CM

## 2015-03-03 DIAGNOSIS — S7002XA Contusion of left hip, initial encounter: Secondary | ICD-10-CM
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
## 2015-03-03 NOTE — Progress Notes (Signed)
Pre visit review using our clinic review tool, if applicable. No additional management support is needed unless otherwise documented below in the visit note. 

## 2015-03-03 NOTE — Patient Instructions (Signed)

## 2015-03-27 ENCOUNTER — Telehealth: Payer: Self-pay | Admitting: Family Medicine

## 2015-03-27 ENCOUNTER — Encounter: Payer: Self-pay | Admitting: Family Medicine

## 2015-03-27 ENCOUNTER — Ambulatory Visit (INDEPENDENT_AMBULATORY_CARE_PROVIDER_SITE_OTHER): Payer: BLUE CROSS/BLUE SHIELD | Admitting: Family Medicine

## 2015-03-27 VITALS — BP 110/70 | HR 74 | Temp 98.5°F | Ht 72.75 in | Wt 244.2 lb

## 2015-03-27 DIAGNOSIS — S43102A Unspecified dislocation of left acromioclavicular joint, initial encounter: Secondary | ICD-10-CM

## 2015-03-27 DIAGNOSIS — J029 Acute pharyngitis, unspecified: Secondary | ICD-10-CM | POA: Diagnosis not present

## 2015-03-27 LAB — POCT RAPID STREP A (OFFICE): RAPID STREP A SCREEN: NEGATIVE

## 2015-03-27 NOTE — Progress Notes (Signed)
Pre visit review using our clinic review tool, if applicable. No additional management support is needed unless otherwise documented below in the visit note. 
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
## 2015-03-27 NOTE — Telephone Encounter (Signed)
Pt has appt on 03/27/15 at 3:15 with Dr Lorelei Pont.

## 2015-11-18 ENCOUNTER — Other Ambulatory Visit: Payer: Self-pay | Admitting: Family Medicine

## 2015-11-18 DIAGNOSIS — Z0000 Encounter for general adult medical examination without abnormal findings: Secondary | ICD-10-CM | POA: Insufficient documentation

## 2015-11-24 ENCOUNTER — Other Ambulatory Visit (INDEPENDENT_AMBULATORY_CARE_PROVIDER_SITE_OTHER): Payer: BLUE CROSS/BLUE SHIELD

## 2015-11-24 DIAGNOSIS — Z0000 Encounter for general adult medical examination without abnormal findings: Secondary | ICD-10-CM | POA: Diagnosis not present

## 2015-11-24 LAB — LIPID PANEL
CHOL/HDL RATIO: 3
CHOLESTEROL: 187 mg/dL (ref 0–200)
HDL: 70.1 mg/dL (ref >39.00)
LDL CALC: 103 mg/dL — AB (ref 0–99)
NonHDL: 117.36
TRIGLYCERIDES: 70 mg/dL (ref 0.0–149.0)
VLDL: 14 mg/dL (ref 0.0–40.0)

## 2015-11-24 LAB — COMPREHENSIVE METABOLIC PANEL
ALBUMIN: 4.7 g/dL (ref 3.5–5.2)
ALK PHOS: 58 U/L (ref 39–117)
ALT: 20 U/L (ref 0–53)
AST: 18 U/L (ref 0–37)
BILIRUBIN TOTAL: 0.5 mg/dL (ref 0.2–1.2)
BUN: 25 mg/dL — AB (ref 6–23)
CO2: 21 mEq/L (ref 19–32)
Calcium: 9.6 mg/dL (ref 8.4–10.5)
Chloride: 105 mEq/L (ref 96–112)
Creatinine, Ser: 0.88 mg/dL (ref 0.40–1.50)
GFR: 94.67 mL/min (ref >60.00)
Glucose, Bld: 105 mg/dL — ABNORMAL HIGH (ref 70–99)
Potassium: 5.2 mEq/L — ABNORMAL HIGH (ref 3.5–5.1)
SODIUM: 141 meq/L (ref 135–145)
TOTAL PROTEIN: 7.1 g/dL (ref 6.0–8.3)

## 2015-11-24 LAB — CBC WITH DIFFERENTIAL/PLATELET
BASOS PCT: 0.7 % (ref 0.0–3.0)
Basophils Absolute: 0 10*3/uL (ref 0.0–0.1)
EOS PCT: 4.4 % (ref 0.0–5.0)
Eosinophils Absolute: 0.3 10*3/uL (ref 0.0–0.7)
HCT: 42.7 % (ref 39.0–52.0)
HEMOGLOBIN: 14.4 g/dL (ref 13.0–17.0)
Lymphocytes Relative: 29.2 % (ref 12.0–46.0)
Lymphs Abs: 2 10*3/uL (ref 0.7–4.0)
MCHC: 33.7 g/dL (ref 30.0–36.0)
MCV: 85.9 fl (ref 78.0–100.0)
MONO ABS: 0.6 10*3/uL (ref 0.1–1.0)
Monocytes Relative: 8.3 % (ref 3.0–12.0)
NEUTROS PCT: 57.4 % (ref 43.0–77.0)
Neutro Abs: 3.9 10*3/uL (ref 1.4–7.7)
Platelets: 269 10*3/uL (ref 150.0–400.0)
RBC: 4.97 Mil/uL (ref 4.22–5.81)
RDW: 13.8 % (ref 11.5–15.5)
WBC: 6.8 10*3/uL (ref 4.0–10.5)

## 2015-11-24 LAB — PSA: PSA: 2.9 ng/mL (ref 0.10–4.00)

## 2015-11-26 ENCOUNTER — Ambulatory Visit: Payer: BLUE CROSS/BLUE SHIELD | Admitting: Podiatry

## 2015-11-27 ENCOUNTER — Encounter: Payer: BLUE CROSS/BLUE SHIELD | Admitting: Family Medicine

## 2015-12-02 ENCOUNTER — Ambulatory Visit (INDEPENDENT_AMBULATORY_CARE_PROVIDER_SITE_OTHER): Payer: BLUE CROSS/BLUE SHIELD | Admitting: Family Medicine

## 2015-12-02 VITALS — BP 122/70 | HR 68 | Temp 97.6°F | Ht 72.5 in | Wt 219.0 lb

## 2015-12-02 DIAGNOSIS — Z0000 Encounter for general adult medical examination without abnormal findings: Secondary | ICD-10-CM | POA: Diagnosis not present

## 2015-12-02 DIAGNOSIS — R399 Unspecified symptoms and signs involving the genitourinary system: Secondary | ICD-10-CM

## 2015-12-02 DIAGNOSIS — E785 Hyperlipidemia, unspecified: Secondary | ICD-10-CM | POA: Diagnosis not present

## 2015-12-02 MED ORDER — TAMSULOSIN HCL 0.4 MG PO CAPS: 0.4000 mg | ORAL_CAPSULE | Freq: Every day | ORAL | Status: DC

## 2015-12-02 NOTE — Progress Notes (Signed)
Pre visit review using our clinic review tool, if applicable. No additional management support is needed unless otherwise documented below in the visit note. 

## 2015-12-02 NOTE — Assessment & Plan Note (Signed)
Reviewed preventive care protocols, scheduled due services, and updated immunizations Discussed nutrition, exercise, diet, and healthy lifestyle.  

## 2015-12-02 NOTE — Patient Instructions (Signed)
Great to see you. Let me know how the flomax works for you.  Use dilute hydrogen peroxide (1:1 with water) a few drops in left ear to help break down ear wax.

## 2015-12-02 NOTE — Assessment & Plan Note (Signed)
Deteriorated. eRx sent for flomax.  Sent rx for flomax for him last year but he never started it.  He is ready to start it now. Call or return to clinic prn if these symptoms worsen or fail to improve as anticipated.

## 2015-12-02 NOTE — Assessment & Plan Note (Signed)
Much improved with diet, weight loss and exercise.

## 2015-12-02 NOTE — Progress Notes (Signed)
60 yo pleasant male here to for CPX.  Colonscopy 2/20/12Sharlett Iles- 10 year recall. Tdap 11/02/2011  Obesity- Feels he has been doing better with compulsive eating.   Wt Readings from Last 3 Encounters:  12/02/15 219 lb (99.338 kg)  03/27/15 244 lb 4 oz (110.791 kg)  03/03/15 254 lb (115.214 kg)    Lab Results  Component Value Date   ALT 20 11/24/2015   AST 18 11/24/2015   ALKPHOS 58 11/24/2015   BILITOT 0.5 11/24/2015    HLD- much improved! Lab Results  Component Value Date   CHOL 187 11/24/2015   HDL 70.10 11/24/2015   LDLCALC 103* 11/24/2015   TRIG 70.0 11/24/2015   CHOLHDL 3 11/24/2015     BPH- feels like his symptoms are a little worse. PSA is stable. Does have to wake up to urinate 6 or so times per night.  Lab Results  Component Value Date   PSA 2.90 11/24/2015   PSA 2.86 02/01/2014   PSA 1.81 11/02/2011    Patient Active Problem List   Diagnosis Date Noted  . Visit for well man health check 11/18/2015  . Lower urinary tract symptoms (LUTS) 05/22/2014  . HLD (hyperlipidemia) 02/05/2014  . Elevated LFTs 02/05/2014  . MORBID OBESITY 08/11/2010  . BENIGN PROSTATIC HYPERTROPHY, WITH URINARY OBSTRUCTION 08/11/2010   Past Medical History  Diagnosis Date  . Obesity     compulsive overeater   Past Surgical History  Procedure Laterality Date  . Cholecystectomy    . Tonsillectomy     Social History  Substance Use Topics  . Smoking status: Never Smoker   . Smokeless tobacco: Never Used  . Alcohol Use: Yes   No family history on file. Allergies  Allergen Reactions  . Penicillins    No current outpatient prescriptions on file prior to visit.   No current facility-administered medications on file prior to visit.   The PMH, PSH, Social History, Family History, Medications, and allergies have been reviewed in [facility], and have been updated if relevant.  Review of Systems  Constitutional: Negative.   HENT: Negative.   Respiratory: Negative.    Cardiovascular: Negative.   Gastrointestinal: Negative.   Endocrine: Negative.   Genitourinary: Positive for urgency and frequency.  Musculoskeletal: Negative.   Skin: Negative.   Allergic/Immunologic: Negative.   Neurological: Negative.   Hematological: Negative.   Psychiatric/Behavioral: Negative.   All other systems reviewed and are negative.    Physical exam: BP 122/70 mmHg  Pulse 68  Temp(Src) 97.6 F (36.4 C) (Oral)  Ht 6' 0.5" (1.842 m)  Wt 219 lb (99.338 kg)  BMI 29.28 kg/m2  SpO2 100% BP Readings from Last 3 Encounters:  12/02/15 122/70  03/27/15 110/70  03/03/15 120/72    Wt Readings from Last 3 Encounters:  12/02/15 219 lb (99.338 kg)  03/27/15 244 lb 4 oz (110.791 kg)  03/03/15 254 lb (115.214 kg)    General:  overweght male in NAD Eyes:  PERRL Ears:  External ear exam shows no significant lesions or deformities.  Otoscopic examination reveals clear canals, tympanic membranes are intact bilaterally without bulging, retraction, inflammation or discharge. Hearing is grossly normal bilaterally. Nose:  External nasal examination shows no deformity or inflammation. Nasal mucosa are pink and moist without lesions or exudates. Mouth:  Oral mucosa and oropharynx without lesions or exudates.  Teeth in good repair. Neck:  no carotid bruit or thyromegaly no cervical or supraclavicular lymphadenopathy  Lungs:  Normal respiratory effort, chest expands symmetrically. Lungs are  clear to auscultation, no crackles or wheezes. Heart:  Normal rate and regular rhythm. S1 and S2 normal without gallop, murmur, click, rub or other extra sounds. Abdomen:  Bowel sounds positive,abdomen soft and non-tender without masses, organomegaly or hernias noted. Pulses:  R and L posterior tibial pulses are full and equal bilaterally  Extremities:  no edema

## 2015-12-29 ENCOUNTER — Ambulatory Visit: Payer: BLUE CROSS/BLUE SHIELD | Admitting: Podiatry

## 2016-01-05 ENCOUNTER — Ambulatory Visit (INDEPENDENT_AMBULATORY_CARE_PROVIDER_SITE_OTHER): Payer: BLUE CROSS/BLUE SHIELD | Admitting: Podiatry

## 2016-01-05 ENCOUNTER — Ambulatory Visit (INDEPENDENT_AMBULATORY_CARE_PROVIDER_SITE_OTHER): Payer: BLUE CROSS/BLUE SHIELD

## 2016-01-05 ENCOUNTER — Encounter: Payer: Self-pay | Admitting: Podiatry

## 2016-01-05 VITALS — BP 122/82 | HR 80 | Resp 16

## 2016-01-05 DIAGNOSIS — L600 Ingrowing nail: Secondary | ICD-10-CM

## 2016-01-05 DIAGNOSIS — Q828 Other specified congenital malformations of skin: Secondary | ICD-10-CM

## 2016-01-05 DIAGNOSIS — M2040 Other hammer toe(s) (acquired), unspecified foot: Secondary | ICD-10-CM

## 2016-01-05 DIAGNOSIS — M722 Plantar fascial fibromatosis: Secondary | ICD-10-CM | POA: Diagnosis not present

## 2016-01-05 NOTE — Progress Notes (Signed)
   Subjective:    Patient ID: Riley Peterson, male    DOB: 08-10-1955, 60 y.o.   MRN: [id number]  HPI: He presents today with chief complaint of a painful forefoot for quite some time. He states that he really masses of his gait and causes his entire foot to be sore.      Review of Systems  Genitourinary: Positive for urgency, frequency and difficulty urinating.  Musculoskeletal: Positive for gait problem.  All other systems reviewed and are negative.      Objective:   Physical Exam: Vital signs are stable he is alert and oriented 3 have reviewed his past medical history medications out a surgery social history. Pulses are palpable. Neurologic sensation is intact deep tendon reflexes are intact muscle strength +5 over 5 dorsiflexion plantar flexors and inverters everters onto the musculatures intact. Orthopedic evaluation was resolved with distal to the ankle, full range of motion without crepitation. Salter porokeratotic lesion subsecond metatarsophalangeal joints bilaterally. These are consistent with porokeratosis.        Assessment & Plan:  Porokeratosis bilateral.  Plan: Mechanical and chemical debridement of the lesions today for follow-up with him on an as-needed basis. Discussed appropriate shoe size ice therapy.

## 2017-01-03 ENCOUNTER — Encounter: Payer: Self-pay | Admitting: Family Medicine

## 2017-01-03 ENCOUNTER — Ambulatory Visit (INDEPENDENT_AMBULATORY_CARE_PROVIDER_SITE_OTHER)
Admission: RE | Admit: 2017-01-03 | Discharge: 2017-01-03 | Disposition: A | Discharge status: Home / Self Care | Payer: BLUE CROSS/BLUE SHIELD | Source: Ambulatory Visit | Attending: Family Medicine | Admitting: Family Medicine

## 2017-01-03 ENCOUNTER — Ambulatory Visit (INDEPENDENT_AMBULATORY_CARE_PROVIDER_SITE_OTHER): Payer: BLUE CROSS/BLUE SHIELD | Admitting: Family Medicine

## 2017-01-03 DIAGNOSIS — M25562 Pain in left knee: Secondary | ICD-10-CM | POA: Diagnosis not present

## 2017-01-03 DIAGNOSIS — M25561 Pain in right knee: Secondary | ICD-10-CM | POA: Diagnosis not present

## 2017-01-03 NOTE — Progress Notes (Deleted)
   Subjective:   Patient ID: Riley Peterson, male    DOB: 1956-07-15, 61 y.o.   MRN: [id number]  Riley Peterson is a pleasant 61 y.o. year old male who presents to clinic today with Leg Pain  on 01/03/2017  HPI:    Current Outpatient Prescriptions on File Prior to Visit  Medication Sig Dispense Refill  . tamsulosin (FLOMAX) 0.4 MG CAPS capsule Take 1 capsule (0.4 mg total) by mouth daily. 30 capsule 3   No current facility-administered medications on file prior to visit.     Allergies  Allergen Reactions  . Penicillins     Past Medical History:  Diagnosis Date  . Obesity    compulsive overeater    Past Surgical History:  Procedure Laterality Date  . CHOLECYSTECTOMY    . TONSILLECTOMY      No family history on file.  Social History   Social History  . Marital status: Married    Spouse name: N/A  . Number of children: N/A  . Years of education: N/A   Occupational History  . pastor [employer]   Social History Main Topics  . Smoking status: Never Smoker  . Smokeless tobacco: Never Used  . Alcohol use Yes  . Drug use: No  . Sexual activity: Not on file   Other Topics Concern  . 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   Review of Systems     Objective:    There were no vitals taken for this visit.   Physical Exam        Assessment & Plan:   No diagnosis found. No Follow-up on file.

## 2017-01-03 NOTE — Progress Notes (Signed)
SUBJECTIVE: Riley Peterson is a 61 y.o. male who complains of  bilateral knee pain for 3 week(s) ago. Mechanism of injury: no known injury. Immediate symptoms: pain behind and above knee caps after sitting for long periods of time.  Symptoms have been constant since that time. Prior history of related problems: no prior problems with this area in the past.  Current Outpatient Prescriptions on File Prior to Visit  Medication Sig Dispense Refill  . tamsulosin (FLOMAX) 0.4 MG CAPS capsule Take 1 capsule (0.4 mg total) by mouth daily. (Patient not taking: Reported on 01/03/2017) 30 capsule 3   No current facility-administered medications on file prior to visit.     Allergies  Allergen Reactions  . Penicillins     Past Medical History:  Diagnosis Date  . Obesity    compulsive overeater    Past Surgical History:  Procedure Laterality Date  . CHOLECYSTECTOMY    . TONSILLECTOMY      No family history on file.  Social History   Social History  . Marital status: Married    Spouse name: N/A  . Number of children: N/A  . Years of education: N/A   Occupational History  . pastor [employer]   Social History Main Topics  . Smoking status: Never Smoker  . Smokeless tobacco: Never Used  . Alcohol use Yes  . Drug use: No  . Sexual activity: Not on file   Other Topics Concern  . 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  OBJECTIVE: BP 120/76   Pulse 72   Wt 264 lb (119.7 kg)   SpO2 97%   BMI 35.31 kg/m   Vital signs as noted above. Appearance: alert, well appearing, and in no distress. Knee exam: normal exam, no swelling, tenderness, instability; ligaments intact, FROM. X-ray: ordered, but results not yet available.  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  PLAN: Xray today. Handout given about supportive care and exercises from sports medicine advisor. Call or return to clinic prn if these symptoms worsen or fail to improve as anticipated. The patient indicates understanding of these issues and agrees with the plan.

## 2017-08-23 IMAGING — DX DG KNEE COMPLETE 4+V*R*
4 series · 4 of 4 positions shown · non-contrast
Comparison: None.

CLINICAL DATA: Right knee pain for the past 3 weeks.

EXAM:
RIGHT KNEE - COMPLETE 4+ VIEW

[knee ap (1 of 2)]
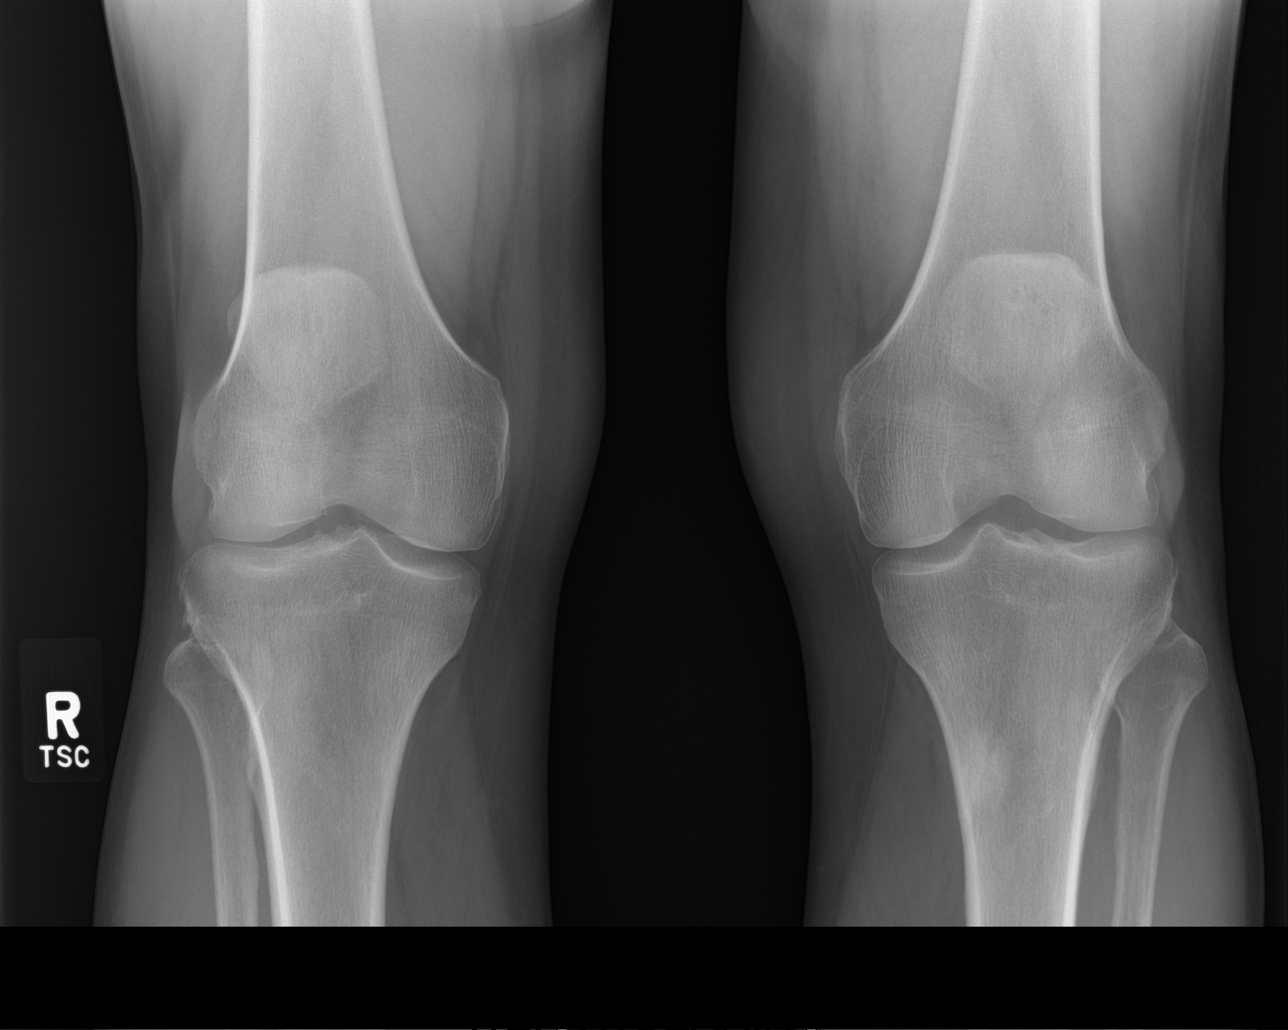

[knee lat]
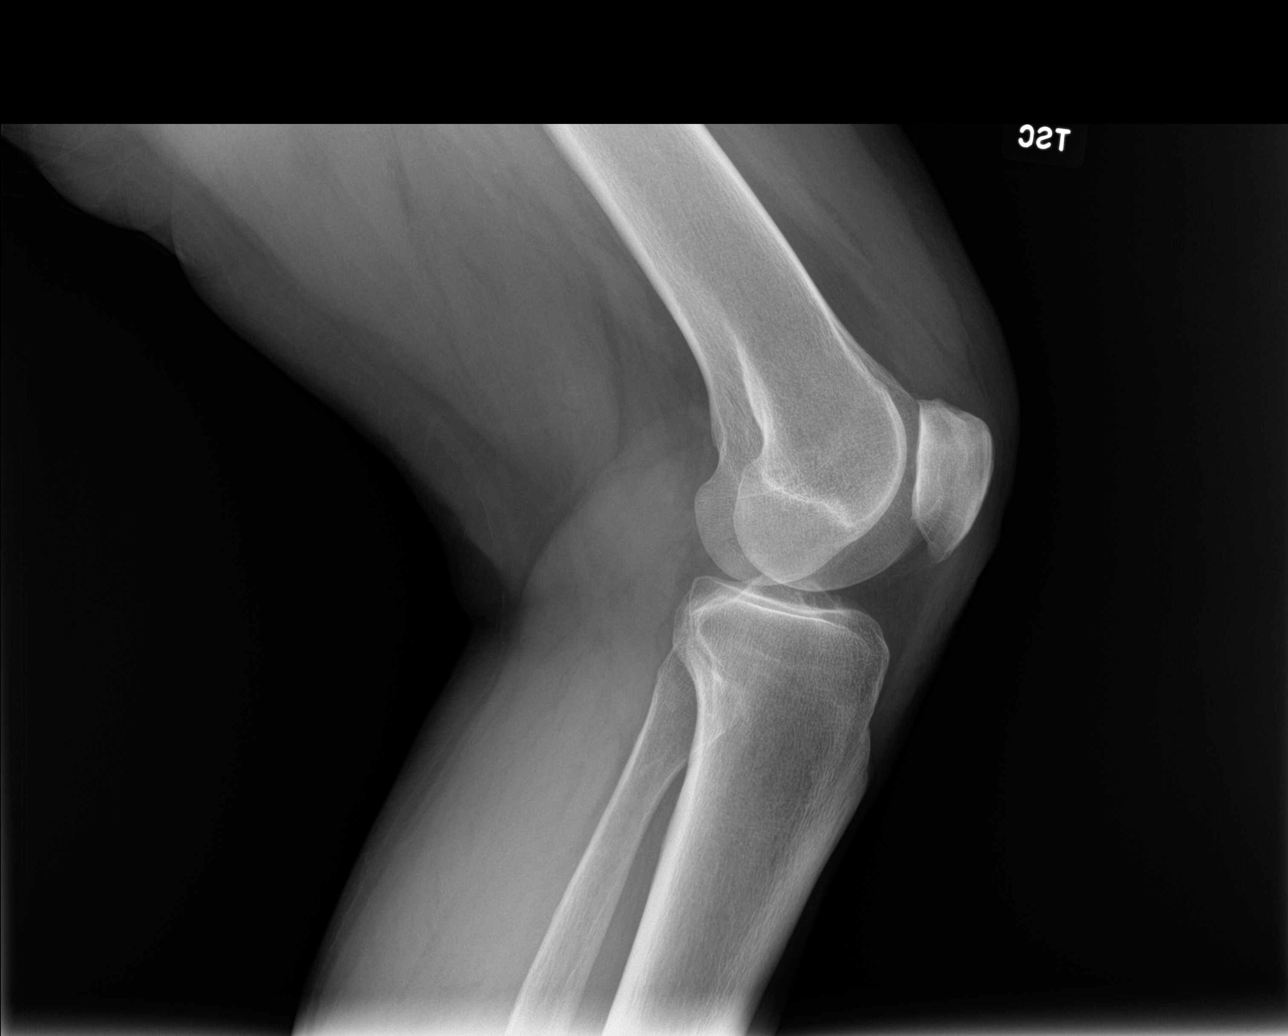

[patella skyline]
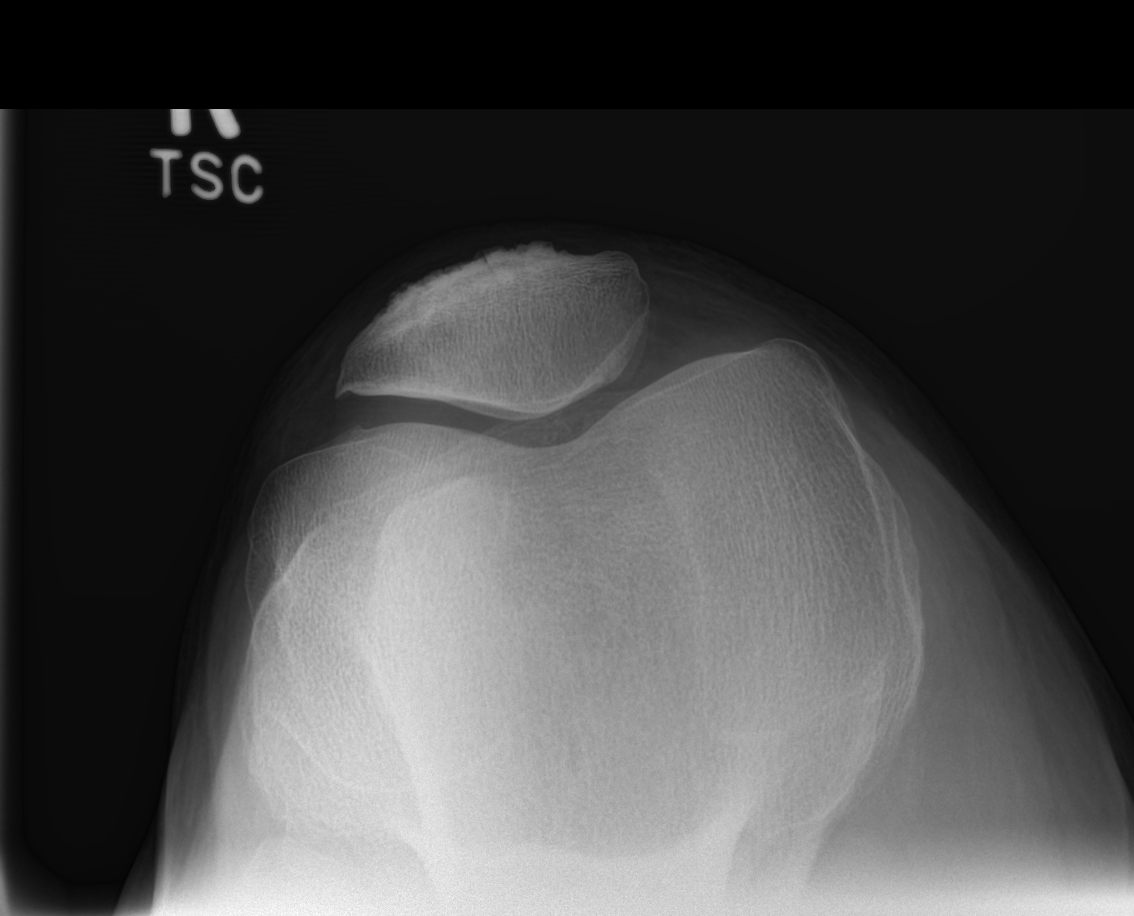

[knee ap (2 of 2)]
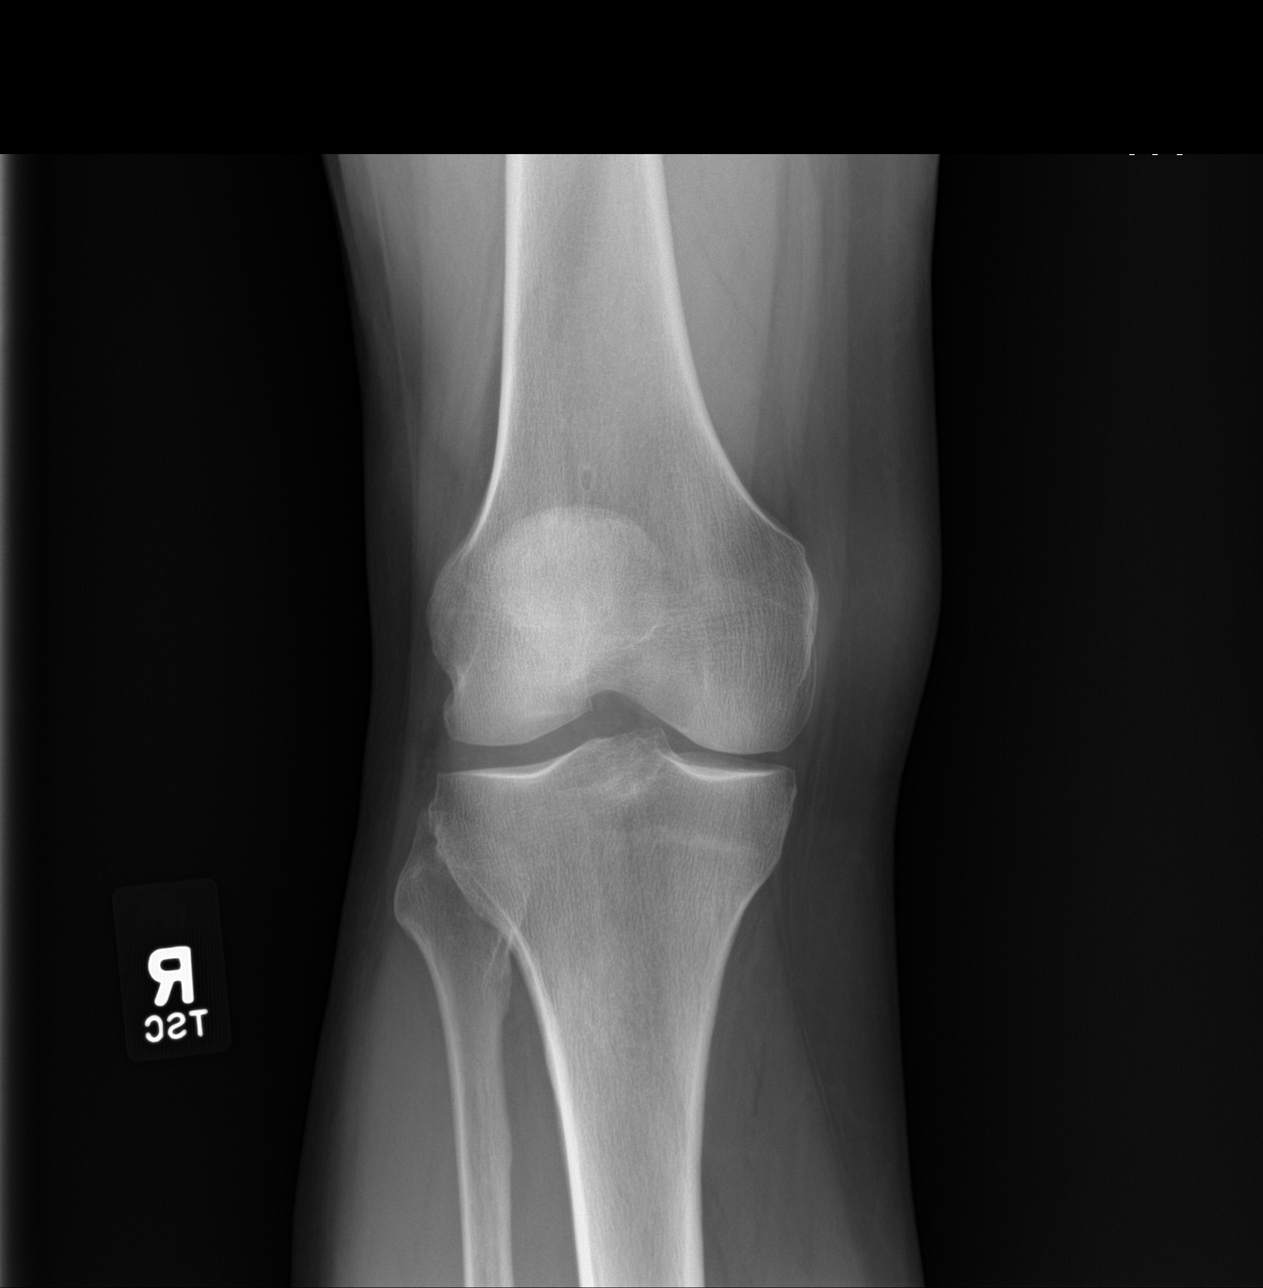

[4 of 4 positions shown; findings below may reference images not displayed]

FINDINGS: No fracture or dislocation. Mild degenerative change of the
patellofemoral joint with osteophytosis about the lateral aspect of
the patella. No joint effusion. No evidence of chondrocalcinosis.
Regional soft tissues appear normal.

Limited AP radiograph of the contralateral left knee appears
unremarkable.
IMPRESSION: 1. No acute findings.
2. Mild degenerative change of the right patellofemoral joint.

## 2018-03-22 ENCOUNTER — Ambulatory Visit (INDEPENDENT_AMBULATORY_CARE_PROVIDER_SITE_OTHER): Payer: BLUE CROSS/BLUE SHIELD | Admitting: Family Medicine

## 2018-03-22 ENCOUNTER — Encounter: Payer: Self-pay | Admitting: Family Medicine

## 2018-03-22 VITALS — BP 130/86 | HR 76 | Temp 98.6°F | Ht 72.75 in | Wt 294.4 lb

## 2018-03-22 DIAGNOSIS — Z0000 Encounter for general adult medical examination without abnormal findings: Secondary | ICD-10-CM | POA: Diagnosis not present

## 2018-03-22 DIAGNOSIS — R399 Unspecified symptoms and signs involving the genitourinary system: Secondary | ICD-10-CM

## 2018-03-22 DIAGNOSIS — Z125 Encounter for screening for malignant neoplasm of prostate: Secondary | ICD-10-CM

## 2018-03-22 DIAGNOSIS — Z1159 Encounter for screening for other viral diseases: Secondary | ICD-10-CM | POA: Diagnosis not present

## 2018-03-22 DIAGNOSIS — E785 Hyperlipidemia, unspecified: Secondary | ICD-10-CM | POA: Diagnosis not present

## 2018-03-22 LAB — LIPID PANEL
CHOLESTEROL: 197 mg/dL (ref 0–200)
HDL: 58.4 mg/dL (ref >39.00)
LDL CALC: 124 mg/dL — AB (ref 0–99)
NonHDL: 139.09
Total CHOL/HDL Ratio: 3
Triglycerides: 75 mg/dL (ref 0.0–149.0)
VLDL: 15 mg/dL (ref 0.0–40.0)

## 2018-03-22 LAB — PSA: PSA: 3.79 ng/mL (ref 0.10–4.00)

## 2018-03-22 LAB — COMPREHENSIVE METABOLIC PANEL
ALBUMIN: 4.4 g/dL (ref 3.5–5.2)
ALT: 27 U/L (ref 0–53)
AST: 20 U/L (ref 0–37)
Alkaline Phosphatase: 62 U/L (ref 39–117)
BUN: 22 mg/dL (ref 6–23)
CHLORIDE: 104 meq/L (ref 96–112)
CO2: 26 mEq/L (ref 19–32)
CREATININE: 0.82 mg/dL (ref 0.40–1.50)
Calcium: 8.9 mg/dL (ref 8.4–10.5)
GFR: 101.88 mL/min (ref >60.00)
Glucose, Bld: 85 mg/dL (ref 70–99)
Potassium: 4.4 mEq/L (ref 3.5–5.1)
SODIUM: 137 meq/L (ref 135–145)
Total Bilirubin: 0.7 mg/dL (ref 0.2–1.2)
Total Protein: 6.9 g/dL (ref 6.0–8.3)

## 2018-03-22 LAB — HEMOGLOBIN A1C: HEMOGLOBIN A1C: 5.6 % (ref 4.6–6.5)

## 2018-03-22 NOTE — Progress Notes (Signed)
Subjective:   Patient ID: Riley Peterson, male    DOB: [birth year], 62 y.o.   MRN: [id number]  Riley Peterson is a pleasant 62 y.o. year old male who presents to clinic today with Annual Exam (Patient is here today for a CPE.  Colonoscopy completed last on 2.20.12 next du 2.18.22.  Immunizations are UTD.  He is currently fasting.  He states that he is willing to have Hep-C and HIV labs if covered by his insurance.)  on 03/22/2018  HPI:  Health Maintenance  Topic Date Due  . Hepatitis C Screening  04/17/1956  . HIV Screening  04/13/1973  . INFLUENZA VACCINE  04/22/2018 (Originally 02/23/2018)  . COLONOSCOPY  09/14/2020  . TETANUS/TDAP  11/01/2021   Colonscopy 2/20/12Sharlett Iles- 10 year recall.  Obesity- chronic struggle, compulsive eater and has gained 30 pounds since last year. The compulsive eaters annonymous group is no longer together.  Wt Readings from Last 3 Encounters:  03/22/18 294 lb 6.4 oz (133.5 kg)  01/03/17 264 lb (119.7 kg)  12/02/15 219 lb (99.3 kg)   H/o HLD- due for labs.  Not currently taking any cholesterol medications.  LUTS- flomax has been effective when he does take it.  Current Outpatient Medications on File Prior to Visit  Medication Sig Dispense Refill  . tamsulosin (FLOMAX) 0.4 MG CAPS capsule Take 1 capsule (0.4 mg total) by mouth daily. (Patient not taking: Reported on 01/03/2017) 30 capsule 3   No current facility-administered medications on file prior to visit.     Allergies  Allergen Reactions  . Penicillins     Past Medical History:  Diagnosis Date  . Obesity    compulsive overeater    Past Surgical History:  Procedure Laterality Date  . CHOLECYSTECTOMY    . TONSILLECTOMY      No family history on file.  Social History   Socioeconomic History  . Marital status: Married    Spouse name: Not on file  . Number of children: Not on file  . Years of education: Not on file  . Highest education level: Not on file  Occupational History    . Occupation: Mining engineer: [employer]  Social Needs  . Financial resource strain: Not on file  . Food insecurity:    Worry: Not on file    Inability: Not on file  . Transportation needs:    Medical: Not on file    Non-medical: Not on file  Tobacco Use  . Smoking status: Never Smoker  . Smokeless tobacco: Never Used  Substance and Sexual Activity  . Alcohol use: Yes  . Drug use: No  . Sexual activity: Not on file  Lifestyle  . Physical activity:    Days per week: Not on file    Minutes per session: Not on file  . Stress: Not on file  Relationships  . Social connections:    Talks on phone: Not on file    Gets together: Not on file    Attends religious service: Not on file    Active member of club or organization: Not on file    Attends meetings of clubs or organizations: Not on file    Relationship status: Not on file  . Intimate partner violence:    Fear of current or ex partner: Not on file    Emotionally abused: Not on file    Physically abused: Not on file    Forced sexual activity: Not on file  Other Topics Concern  .  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   Review of Systems  Constitutional: Negative.   HENT: Negative.   Eyes: Negative.   Respiratory: Negative.   Cardiovascular: Negative.   Gastrointestinal: Negative.   Endocrine: Negative.   Genitourinary: Negative.   Musculoskeletal: Negative.   Allergic/Immunologic: Negative.   Neurological: Negative.   Hematological: Negative.   Psychiatric/Behavioral: Negative.   All other systems reviewed and are negative.      Objective:    BP 130/86 (BP Location: Left Arm, Patient Position: Sitting, Cuff Size: Large)   Pulse 76   Temp 98.6 F (37 C) (Oral)   Ht 6' 0.75" (1.848 m)   Wt 294 lb 6.4 oz (133.5  kg)   SpO2 97%   BMI 39.11 kg/m    Physical Exam  General:  pleasant male in no acute distress Eyes:  PERRL Ears:  External ear exam shows no significant lesions or deformities.  TMs normal bilaterally Hearing is grossly normal bilaterally. Nose:  External nasal examination shows no deformity or inflammation. Nasal mucosa are pink and moist without lesions or exudates. Mouth:  Oral mucosa and oropharynx without lesions or exudates.  Teeth in good repair. Neck:  no carotid bruit or thyromegaly no cervical or supraclavicular lymphadenopathy  Lungs:  Normal respiratory effort, chest expands symmetrically. Lungs are clear to auscultation, no crackles or wheezes. Heart:  Normal rate and regular rhythm. S1 and S2 normal without gallop, murmur, click, rub or other extra sounds. Abdomen:  Bowel sounds positive,abdomen soft and non-tender without masses, organomegaly or hernias noted. Pulses:  R and L posterior tibial pulses are full and equal bilaterally  Extremities:  no edema  Psych:  Good eye contact, not anxious or depressed appearing       Assessment & Plan:   Visit for well man health check  Lower urinary tract symptoms (LUTS) - Plan: PSA  Hyperlipidemia, unspecified hyperlipidemia type - Plan: Lipid panel, Comprehensive metabolic panel  Screening for malignant neoplasm of prostate - Plan: PSA  Need for hepatitis C screening test - Plan: Hepatitis C Antibody  MORBID OBESITY No follow-ups on file.

## 2018-03-22 NOTE — Patient Instructions (Signed)
Great to see you. I will call you with your lab results from today and you can view them online.   Happy [birth year] next [birth year]!

## 2018-03-22 NOTE — Assessment & Plan Note (Signed)
Reviewed preventive care protocols, scheduled due services, and updated immunizations Discussed nutrition, exercise, diet, and healthy lifestyle.  

## 2018-03-22 NOTE — Assessment & Plan Note (Signed)
Deteriorated.  He will get back on track with his therapist and compulsive eaters online group.

## 2018-03-22 NOTE — Assessment & Plan Note (Signed)
Continue flomax. PSA today.

## 2018-03-22 NOTE — Assessment & Plan Note (Signed)
Repeat lipid panel and CMET today.

## 2018-03-23 LAB — HEPATITIS C ANTIBODY
Hepatitis C Ab: NONREACTIVE
SIGNAL TO CUT-OFF: 0.03 (ref <1.00)

## 2018-05-02 ENCOUNTER — Ambulatory Visit: Payer: BLUE CROSS/BLUE SHIELD | Admitting: Family Medicine

## 2018-05-03 ENCOUNTER — Encounter: Payer: Self-pay | Admitting: Family Medicine

## 2018-05-03 ENCOUNTER — Ambulatory Visit: Payer: BLUE CROSS/BLUE SHIELD | Admitting: Family Medicine

## 2018-05-03 VITALS — BP 104/64 | HR 82 | Temp 98.5°F | Ht 73.0 in | Wt 277.4 lb

## 2018-05-03 DIAGNOSIS — J069 Acute upper respiratory infection, unspecified: Secondary | ICD-10-CM

## 2018-05-03 MED ORDER — FLUTICASONE PROPIONATE 50 MCG/ACT NA SUSP: 2.0000 | Freq: Every day | NASAL | 6 refills | Status: DC

## 2018-05-03 MED ORDER — DOXYCYCLINE HYCLATE 100 MG PO TABS: 100.0000 mg | ORAL_TABLET | Freq: Two times a day (BID) | ORAL | 0 refills | Status: DC

## 2018-05-03 NOTE — Progress Notes (Signed)
SUBJECTIVE:  Riley Peterson is a 62 y.o. male who complains of coryza, congestion, sneezing, productive cough and both sinus pain for 1 month, getting progressively worse over past few weeks. He denies a history of anorexia, chest pain, chills, dizziness and fatigue and denies a history of asthma. Patient denies smoke cigarettes.  OTC antihistamine has not been helpful.   Current Outpatient Medications on File Prior to Visit  Medication Sig Dispense Refill  . tamsulosin (FLOMAX) 0.4 MG CAPS capsule Take 1 capsule (0.4 mg total) by mouth daily. (Patient not taking: Reported on 01/03/2017) 30 capsule 3   No current facility-administered medications on file prior to visit.     Allergies  Allergen Reactions  . Penicillins     Past Medical History:  Diagnosis Date  . Obesity    compulsive overeater    Past Surgical History:  Procedure Laterality Date  . CHOLECYSTECTOMY    . TONSILLECTOMY      No family history on file.  Social History   Socioeconomic History  . Marital status: Married    Spouse name: Not on file  . Number of children: Not on file  . Years of education: Not on file  . Highest education level: Not on file  Occupational History  . Occupation: Mining engineer: [employer]  Social Needs  . Financial resource strain: Not on file  . Food insecurity:    Worry: Not on file    Inability: Not on file  . Transportation needs:    Medical: Not on file    Non-medical: Not on file  Tobacco Use  . Smoking status: Never Smoker  . Smokeless tobacco: Never Used  Substance and Sexual Activity  . Alcohol use: Yes  . Drug use: No  . Sexual activity: Not on file  Lifestyle  . Physical activity:    Days per week: Not on file    Minutes per session: Not on file  . Stress: Not on file  Relationships  . Social connections:    Talks on phone: Not on file    Gets together: Not on file    Attends religious service: Not on file    Active member of club or  organization: Not on file    Attends meetings of clubs or organizations: Not on file    Relationship status: Not on file  . Intimate partner violence:    Fear of current or ex partner: Not on file    Emotionally abused: Not on file    Physically abused: Not on file    Forced sexual activity: Not on file  Other Topics Concern  . 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  OBJECTIVE: BP 104/64 (BP Location: Left Arm, Patient Position: Sitting, Cuff Size: Normal)   Pulse 82   Temp 98.5 F (36.9 C) (Oral)   Ht 6\' 1"  (1.854 m)   Wt 277 lb 6.4 oz (125.8 kg)   SpO2 95%   BMI 36.60 kg/m   He appears well, vital signs are as noted. Ears normal.  Throat and pharynx normal.  Neck supple. No adenopathy in the neck. Nose is congested. Sinuses  tender. The chest with scattered wheezes.  ASSESSMENT:  sinusitis and allergic rhinitis  PLAN: Doxycyline 100 mg twice daily x 7 days,  flonase, allegra D. Symptomatic therapy suggested: push fluids, rest and return office visit prn if symptoms persist or worsen.  Call or return to clinic prn if these symptoms worsen or fail to improve as anticipated. The patient indicates understanding of these issues and agrees with the plan.

## 2018-05-03 NOTE — Patient Instructions (Addendum)
Great to see you. Please start doxycycline 100 mg twice daily x 7 days.  Start floase, Human resources officer D,keep me updated.

## 2018-05-15 ENCOUNTER — Other Ambulatory Visit: Payer: Self-pay | Admitting: Family Medicine

## 2018-05-15 ENCOUNTER — Encounter: Payer: Self-pay | Admitting: Family Medicine

## 2018-05-15 NOTE — Telephone Encounter (Signed)
Copied from [place] [id number]. Topic: Quick Communication - Rx Refill/Question >> May 15, 2018  3:29 PM Gardiner Ramus wrote: Medication: tamsulosin (FLOMAX) 0.4 MG CAPS capsule [[id number]]   Has the patient contacted their pharmacy? No Preferred Pharmacy (with phone number or street name): [id number] [address], [place] [phone] (Phone) [phone] (Fax)  Agent: Please be advised that RX refills may take up to 3 business days. We ask that you follow-up with your pharmacy.

## 2018-05-16 NOTE — Telephone Encounter (Signed)
Requested medication (s) are due for refill today: yes  Requested medication (s) are on the active medication list: yes  Last refill:  11/29/15  Future visit scheduled: no  Notes to clinic:  Last office visit 05/03/18. This prescription expired on 12/01/16.   Requested Prescriptions  Pending Prescriptions Disp Refills   tamsulosin (FLOMAX) 0.4 MG CAPS capsule 30 capsule 3    Sig: Take 1 capsule (0.4 mg total) by mouth daily.     Urology: Alpha-Adrenergic Blocker Passed - 05/16/2018 10:21 AM      Passed - Last BP in normal range    BP Readings from Last 1 Encounters:  05/03/18 104/64         Passed - Valid encounter within last 12 months    Recent Outpatient Visits          1 week ago Upper respiratory tract infection, unspecified type   [facility] [address], Marciano Sequin, MD   1 month ago Visit for well man health check   [facility] [address], Marciano Sequin, MD

## 2018-05-16 NOTE — Telephone Encounter (Signed)
Pt states he is still taking the Flomax as needed. He said he had a little problem getting started with his urine a couple weeks ago and started taking them. They really helped so he is requesting a refill. What he has at home has expired.

## 2018-05-16 NOTE — Telephone Encounter (Signed)
Attempted to call patient and clarify why he needs the Flomax. Per chart, pt reported not taking on 01/03/17. No answer and unable to leave a message.

## 2018-05-17 MED ORDER — TAMSULOSIN HCL 0.4 MG PO CAPS: 0.4000 mg | ORAL_CAPSULE | Freq: Every day | ORAL | 3 refills | Status: DC

## 2018-05-18 ENCOUNTER — Ambulatory Visit: Payer: BLUE CROSS/BLUE SHIELD | Admitting: Family Medicine

## 2018-05-18 ENCOUNTER — Ambulatory Visit (INDEPENDENT_AMBULATORY_CARE_PROVIDER_SITE_OTHER): Payer: BLUE CROSS/BLUE SHIELD

## 2018-05-18 ENCOUNTER — Encounter: Payer: Self-pay | Admitting: Family Medicine

## 2018-05-18 VITALS — BP 126/74 | HR 86 | Ht 73.0 in | Wt 276.0 lb

## 2018-05-18 DIAGNOSIS — M25562 Pain in left knee: Secondary | ICD-10-CM

## 2018-05-18 DIAGNOSIS — M25561 Pain in right knee: Secondary | ICD-10-CM

## 2018-05-18 NOTE — Assessment & Plan Note (Signed)
Pain less likely be associated with arthritis more likely to be associated with degenerative meniscal change. -Counseled on home exercise therapy and supportive care -If no improvement could consider injection or physical therapy

## 2018-05-18 NOTE — Patient Instructions (Signed)
Nice to meet you  Please try the exercises  Please try compression and ice on the knees  Please try tylenol for the pain  Please see me back in 4-6 weeks if there isn't improvement.

## 2018-05-18 NOTE — Progress Notes (Signed)
Riley Peterson - 62 y.o. male MRN [id number]  Date of birth: [birth year]  SUBJECTIVE:  Including CC & ROS.  Chief Complaint  Patient presents with  . Knee Pain    Riley Peterson is a 62 y.o. male that is presenting with right and left knee pain. Ongoing for two months. Knee pain is bilateral, right is worse than his left.  Pain is located lateral aspect of his right knee. Admits to swelling. Pain is worse during extension. Denies injury or trauma. He has been walking and cycling.  Pain is localized to the knee.  Denies any mechanism injury.  The pain is mild in nature.  He noticed it more when he is walking up and down the stairs while he had a move something recently.  He does try to exercise on a regular basis.  I, Verlene Mayer, CMA serving as a Education administrator for [employer] review of the right knee x-ray from 01/03/2017 shows mild medial joint space narrowing.   Review of Systems  Constitutional: Negative for fever.  HENT: Negative for congestion.   Respiratory: Negative for cough.   Cardiovascular: Negative for chest pain.  Gastrointestinal: Negative for abdominal pain.  Musculoskeletal: Negative for gait problem.  Skin: Negative for color change.  Neurological: Negative for weakness.  Hematological: Negative for adenopathy.  Psychiatric/Behavioral: Negative for agitation.    HISTORY: Past Medical, Surgical, Social, and Family History Reviewed & Updated per EMR.   Pertinent Historical Findings include:  Past Medical History:  Diagnosis Date  . Obesity    compulsive overeater    Past Surgical History:  Procedure Laterality Date  . CHOLECYSTECTOMY    . TONSILLECTOMY      Allergies  Allergen Reactions  . Penicillins     No family history on file.   Social History   Socioeconomic History  . Marital status: Married    Spouse name: Not on file  . Number of children: Not on file  . Years of education: Not on file  . Highest education level: Not on  file  Occupational History  . Occupation: Mining engineer: [employer]  Social Needs  . Financial resource strain: Not on file  . Food insecurity:    Worry: Not on file    Inability: Not on file  . Transportation needs:    Medical: Not on file    Non-medical: Not on file  Tobacco Use  . Smoking status: Never Smoker  . Smokeless tobacco: Never Used  Substance and Sexual Activity  . Alcohol use: Yes  . Drug use: No  . Sexual activity: Not on file  Lifestyle  . Physical activity:    Days per week: Not on file    Minutes per session: Not on file  . Stress: Not on file  Relationships  . Social connections:    Talks on phone: Not on file    Gets together: Not on file    Attends religious service: Not on file    Active member of club or organization: Not on file    Attends meetings of clubs or organizations: Not on file    Relationship status: Not on file  . Intimate partner violence:    Fear of current or ex partner: Not on file    Emotionally abused: Not on file    Physically abused: Not on file    Forced sexual activity: Not on file  Other Topics Concern  . 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 Normal range of motion. Normal strength resistance. Negative McMurray's test. Tenderness palpation of the lateral femoral condyle on the right. No pain with patellar  grind. Neurovascularly intact  Limited ultrasound: Right knee:  Mild effusion in the suprapatellar pouch. Normal-appearing quadricep and patellar tendon. Medial meniscus on the anterior horn appears to have a mild hypoechoic change to suggest a tear. 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 -Counseled on home exercise therapy and supportive care -If no improvement could consider injection or physical therapy   The above documentation has been reviewed and is accurate and complete. Clearance Coots, MD 05/18/2018, 10:57 AM>

## 2018-07-25 ENCOUNTER — Telehealth: Payer: Self-pay

## 2018-07-25 NOTE — Telephone Encounter (Signed)
Copied from [place] [phone]. Topic: General - Other >> Jul 25, 2018 10:08 AM Judyann Munson wrote: Reason for CRM: Pt is calling to state he is needing a note that could be emailed to  [email or web address]  that state his knee was a  one time thing. And he is completely fine and no longer has issue with his knee. Please advise best contact number [phone]

## 2018-07-27 NOTE — Telephone Encounter (Signed)
TA-Plz see pt req/looks like he was seen by Dr. Raeford Razor for knee pain in October/plz advise if a note is ok for this request as he wants it written that he is completely fine and there are no longer issues with knees/plz advise/thx dmf

## 2018-07-27 NOTE — Telephone Encounter (Signed)
I think this would be a question more appropriate for Dr. Raeford Razor to answer since he last evaluated him for  This.

## 2018-07-31 NOTE — Telephone Encounter (Signed)
Spoke with patient and provided note.   Rosemarie Ax, MD [facility] 07/31/2018, 4:49 PM

## 2019-07-23 ENCOUNTER — Ambulatory Visit: Payer: BC Managed Care – PPO | Attending: Internal Medicine

## 2019-07-23 DIAGNOSIS — Z20828 Contact with and (suspected) exposure to other viral communicable diseases: Secondary | ICD-10-CM | POA: Diagnosis not present

## 2019-07-23 DIAGNOSIS — Z20822 Contact with and (suspected) exposure to covid-19: Secondary | ICD-10-CM

## 2019-07-25 LAB — NOVEL CORONAVIRUS, NAA: SARS-CoV-2, NAA: NOT DETECTED

## 2019-09-17 ENCOUNTER — Other Ambulatory Visit: Payer: Self-pay

## 2019-09-17 ENCOUNTER — Ambulatory Visit: Payer: BC Managed Care – PPO | Admitting: Family Medicine

## 2019-09-17 ENCOUNTER — Encounter: Payer: Self-pay | Admitting: Family Medicine

## 2019-09-17 VITALS — BP 122/72 | HR 99 | Temp 98.4°F | Wt 297.0 lb

## 2019-09-17 DIAGNOSIS — R350 Frequency of micturition: Secondary | ICD-10-CM | POA: Diagnosis not present

## 2019-09-17 DIAGNOSIS — N401 Enlarged prostate with lower urinary tract symptoms: Secondary | ICD-10-CM | POA: Diagnosis not present

## 2019-09-17 DIAGNOSIS — E785 Hyperlipidemia, unspecified: Secondary | ICD-10-CM

## 2019-09-17 MED ORDER — TAMSULOSIN HCL 0.4 MG PO CAPS: 0.4000 mg | ORAL_CAPSULE | Freq: Every day | ORAL | 3 refills | Status: DC

## 2019-09-17 NOTE — Progress Notes (Signed)
Subjective:     Riley Peterson is a 64 y.o. male presenting for New Patient (Initial Visit)     HPI   #BPH - flomax - does not take this all the time because once he ejectulated and nothing came out - feels like overtime it is not helping - will have spasmodic symptoms - frequency - nocturia - every night, 4-5 times, occasionally 8-9 times  #Weight gain - not currently exercise - has been in a 12-step program for compulsive eaters  - was living in [place] when he started and lost 120 lbs  - similar to [employer] - did lose to the low 200s - gains weight and loses relatively fast - better when he is exercising - planning to restart diet on March 1   Review of Systems   Social History   Tobacco Use  Smoking Status Never Smoker  Smokeless Tobacco Never Used        Objective:    BP Readings from Last 3 Encounters:  09/17/19 122/72  05/18/18 126/74  05/03/18 104/64   Wt Readings from Last 3 Encounters:  09/17/19 297 lb (134.7 kg)  05/18/18 276 lb (125.2 kg)  05/03/18 277 lb 6.4 oz (125.8 kg)    BP 122/72 (BP Location: Left Arm, Patient Position: Sitting, Cuff Size: Normal)   Pulse 99   Temp 98.4 F (36.9 C) (Temporal)   Wt 297 lb (134.7 kg)   BMI 39.18 kg/m    Physical Exam Constitutional:      Appearance: Normal appearance. He is not ill-appearing or diaphoretic.  HENT:     Right Ear: External ear normal.     Left Ear: External ear normal.     Nose: Nose normal.  Eyes:     General: No scleral icterus.    Extraocular Movements: Extraocular movements intact.     Conjunctiva/sclera: Conjunctivae normal.  Cardiovascular:     Rate and Rhythm: Normal rate and regular rhythm.     Heart sounds: No murmur.  Pulmonary:     Effort: Pulmonary effort is normal. No respiratory distress.     Breath sounds: Normal breath sounds. No wheezing.  Musculoskeletal:     Cervical back: Neck supple.  Skin:    General: Skin is warm and dry.    Neurological:     Mental Status: He is alert. Mental status is at baseline.  Psychiatric:        Mood and Affect: Mood normal.      The 10-year ASCVD risk score Mikey Bussing DC Jr., et al., 2013) is: 7.6%   Values used to calculate the score:     Age: 89 years     Sex: Male     Is Non-Hispanic African American: No     Diabetic: No     Tobacco smoker: No     Systolic Blood Pressure: 123XX123 mmHg     Is BP treated: No     HDL Cholesterol: 58.4 mg/dL     Total Cholesterol: 197 mg/dL      Assessment & Plan:   Problem List Items Addressed This Visit      Genitourinary   BPH (benign prostatic hyperplasia) - Primary    Advised that patient take medication daily as he has daily symptoms which appear severe. If no significant improvement in 2-3 weeks, will increase dose. Offered urology referral if interested in procedure, but pt will focus on daily medication use first.       Relevant Medications  tamsulosin (FLOMAX) 0.4 MG CAPS capsule     Other   MORBID OBESITY    Pt planning to start working on weight loss and exercise on March 1. Will return for annual w/ exam in 2 months after he has been working on life style changes      HLD (hyperlipidemia)    ASCVD was elevated based on prior labs. Pt planning to work on life style. Will recheck labs in a few months and discuss medication if indicated at that time.           Return in about 3 months (around 12/15/2019).  Lesleigh Noe, MD

## 2019-09-17 NOTE — Patient Instructions (Signed)
#   Enlarged Prostate   Your Baseline symptoms: how often over the last 2 weeks 1) Nocturia -- 8 times  2) Urgency 3) Frequency 4) Leakage after urinating 5) Some Hesitancy 6) Difficulty emptying bladder  When on medication think back to the previous week and compare how often you are having symptoms to prior to taking medication

## 2019-09-17 NOTE — Assessment & Plan Note (Signed)
Pt planning to start working on weight loss and exercise on March 1. Will return for annual w/ exam in 2 months after he has been working on life style changes

## 2019-09-17 NOTE — Assessment & Plan Note (Signed)
Advised that patient take medication daily as he has daily symptoms which appear severe. If no significant improvement in 2-3 weeks, will increase dose. Offered urology referral if interested in procedure, but pt will focus on daily medication use first.

## 2019-09-17 NOTE — Assessment & Plan Note (Signed)
ASCVD was elevated based on prior labs. Pt planning to work on life style. Will recheck labs in a few months and discuss medication if indicated at that time.

## 2020-10-14 ENCOUNTER — Ambulatory Visit: Payer: Self-pay | Admitting: Family Medicine

## 2020-10-14 ENCOUNTER — Other Ambulatory Visit: Payer: BC Managed Care – PPO

## 2020-10-14 ENCOUNTER — Other Ambulatory Visit: Payer: Self-pay

## 2020-10-14 ENCOUNTER — Telehealth: Payer: Self-pay | Admitting: Family Medicine

## 2020-10-14 DIAGNOSIS — N401 Enlarged prostate with lower urinary tract symptoms: Secondary | ICD-10-CM

## 2020-10-14 DIAGNOSIS — Z0289 Encounter for other administrative examinations: Secondary | ICD-10-CM

## 2020-10-14 MED ORDER — TAMSULOSIN HCL 0.4 MG PO CAPS: 0.4000 mg | ORAL_CAPSULE | Freq: Every day | ORAL | 0 refills | Status: DC

## 2020-10-14 NOTE — Telephone Encounter (Signed)
  LAST APPOINTMENT DATE: 10/14/2020   NEXT APPOINTMENT DATE:@3 /29/2022  MEDICATION: Tamsulosin 0.4mg   PHARMACY: [facility]- [address]  Let patient know to contact pharmacy at the end of the day to make sure medication is ready.  Please notify patient to allow 48-72 hours to process  Encourage patient to contact the pharmacy for refills or they can request refills through [facility]:   LAST REFILL:  QTY:  REFILL DATE:    OTHER COMMENTS:    Okay for refill?  Please advise

## 2020-10-14 NOTE — Telephone Encounter (Signed)
90 day supply sent into requested pharmacy.

## 2020-10-14 NOTE — Telephone Encounter (Signed)
[place], [place]  Is the correct pharmacy address.

## 2020-10-21 ENCOUNTER — Ambulatory Visit (INDEPENDENT_AMBULATORY_CARE_PROVIDER_SITE_OTHER): Payer: BC Managed Care – PPO | Admitting: Family Medicine

## 2020-10-21 ENCOUNTER — Other Ambulatory Visit: Payer: Self-pay

## 2020-10-21 VITALS — BP 122/62 | HR 78 | Temp 98.2°F | Ht 72.0 in | Wt 272.8 lb

## 2020-10-21 DIAGNOSIS — Z125 Encounter for screening for malignant neoplasm of prostate: Secondary | ICD-10-CM | POA: Diagnosis not present

## 2020-10-21 DIAGNOSIS — Z1211 Encounter for screening for malignant neoplasm of colon: Secondary | ICD-10-CM | POA: Diagnosis not present

## 2020-10-21 DIAGNOSIS — Z0000 Encounter for general adult medical examination without abnormal findings: Secondary | ICD-10-CM

## 2020-10-21 DIAGNOSIS — E785 Hyperlipidemia, unspecified: Secondary | ICD-10-CM | POA: Diagnosis not present

## 2020-10-21 DIAGNOSIS — M25551 Pain in right hip: Secondary | ICD-10-CM | POA: Insufficient documentation

## 2020-10-21 DIAGNOSIS — M159 Polyosteoarthritis, unspecified: Secondary | ICD-10-CM | POA: Insufficient documentation

## 2020-10-21 DIAGNOSIS — Z23 Encounter for immunization: Secondary | ICD-10-CM | POA: Diagnosis not present

## 2020-10-21 DIAGNOSIS — N401 Enlarged prostate with lower urinary tract symptoms: Secondary | ICD-10-CM | POA: Diagnosis not present

## 2020-10-21 DIAGNOSIS — Z131 Encounter for screening for diabetes mellitus: Secondary | ICD-10-CM | POA: Diagnosis not present

## 2020-10-21 DIAGNOSIS — R350 Frequency of micturition: Secondary | ICD-10-CM

## 2020-10-21 LAB — COMPREHENSIVE METABOLIC PANEL
ALT: 16 U/L (ref 0–53)
AST: 15 U/L (ref 0–37)
Albumin: 4.5 g/dL (ref 3.5–5.2)
Alkaline Phosphatase: 69 U/L (ref 39–117)
BUN: 14 mg/dL (ref 6–23)
CO2: 30 mEq/L (ref 19–32)
Calcium: 9.5 mg/dL (ref 8.4–10.5)
Chloride: 102 mEq/L (ref 96–112)
Creatinine, Ser: 0.78 mg/dL (ref 0.40–1.50)
GFR: 95.57 mL/min (ref >60.00)
Glucose, Bld: 105 mg/dL — ABNORMAL HIGH (ref 70–99)
Potassium: 4.6 mEq/L (ref 3.5–5.1)
Sodium: 137 mEq/L (ref 135–145)
Total Bilirubin: 0.6 mg/dL (ref 0.2–1.2)
Total Protein: 6.9 g/dL (ref 6.0–8.3)

## 2020-10-21 LAB — LIPID PANEL
Cholesterol: 160 mg/dL (ref 0–200)
HDL: 40.6 mg/dL (ref >39.00)
LDL Cholesterol: 102 mg/dL — ABNORMAL HIGH (ref 0–99)
NonHDL: 119.76
Total CHOL/HDL Ratio: 4
Triglycerides: 89 mg/dL (ref 0.0–149.0)
VLDL: 17.8 mg/dL (ref 0.0–40.0)

## 2020-10-21 LAB — PSA: PSA: 4.16 ng/mL — ABNORMAL HIGH (ref 0.10–4.00)

## 2020-10-21 LAB — HEMOGLOBIN A1C: Hgb A1c MFr Bld: 5.3 % (ref 4.6–6.5)

## 2020-10-21 MED ORDER — ATORVASTATIN CALCIUM 10 MG PO TABS: 10.0000 mg | ORAL_TABLET | Freq: Every day | ORAL | 3 refills | Status: DC

## 2020-10-21 NOTE — Progress Notes (Signed)
Annual Exam   Chief Complaint:  Chief Complaint  Patient presents with  . Annual Exam    Doubled his flomax and it's not helping much  . Referral    urology  . Hip Pain    R hip    History of Present Illness:  Riley Peterson is a 65 y.o. presents today for annual examination.    #urgency and incontinence - taking flomax - difficulty  - also having ED  Hip pain - groin location - impacts walking  - improves with bicycling   Nutrition/Lifestyle Diet: has been gaining/losing weight - Jan 8 has lost 41 lbs - CEA-how (compulsive eaters anonymous)  Exercise: started, but now with some hip pain He is single partner, contraception - post menopausal status.  Any issues with getting or keeping erection? Yes  Social History   Tobacco Use  Smoking Status Never Smoker  Smokeless Tobacco Never Used   Social History   Substance and Sexual Activity  Alcohol Use Yes   Comment: rare, 1 glass of wine occasionally   Social History   Substance and Sexual Activity  Drug Use No     Safety The patient wears seatbelts: yes.     The patient feels safe at home and in their relationships: yes.  General Health Dentist in the last year: Yes Eye doctor: no  Weight Wt Readings from Last 3 Encounters:  10/21/20 272 lb 12 oz (123.7 kg)  09/17/19 297 lb (134.7 kg)  05/18/18 276 lb (125.2 kg)   Patient has high BMI  BMI Readings from Last 1 Encounters:  10/21/20 36.99 kg/m     Chronic disease screening Blood pressure monitoring:  BP Readings from Last 3 Encounters:  10/21/20 122/62  09/17/19 122/72  05/18/18 126/74    Lipid Monitoring: Indication for screening: age >35, obesity, diabetes, family hx, CV risk factors.  Lipid screening: Yes  Lab Results  Component Value Date   CHOL 197 03/22/2018   HDL 58.40 03/22/2018   LDLCALC 124 (H) 03/22/2018   TRIG 75.0 03/22/2018   CHOLHDL 3 03/22/2018     Diabetes Screening: age >80, overweight, family hx, PCOS, hx of  gestational diabetes, at risk ethnicity, elevated blood pressure >135/80.  Diabetes Screening screening: Yes  Lab Results  Component Value Date   HGBA1C 5.6 03/22/2018     Prostate Cancer Screening: Yes Age 34-69 yo Shared Decision Making Higher Risk: Older age, African American, Family Hx of Prostate Cancer - No Benefits: screening may prevent 1.3 deaths from prostate cancer over 13 years per 1000 men screened and prevent 3 metastatic cases per 1000 men screened. Not enough evidence to support more benefit for AA or [place] Harms: False Positive and psychological harms. 15% of me with false positive over a 2 to 4 year period > resulting in biopsy and complications such as pain, hematospermia, infections. Overdiagnosis - increases with age - found that 20-50% of prostate cancer through screening may have never caused any issues. Harms of treatment include - erectile dysfunction, urinary incontinence, and bothersome bowel symptoms.   After discussion he does want to get a PSA checked today.   Inadequate evidence for screening <55 No mortality benefit for screening >70   Lab Results  Component Value Date   PSA 3.79 03/22/2018   PSA 2.90 11/24/2015   PSA 2.86 02/01/2014       Colon Cancer Screening:  Age 64-75 yo - benefits outweigh the risk. Adults 32-85 yo who have never been screened benefit.  Benefits: 134000 people in 2016 will be diagnosed and 49,000 will die - early detection helps Harms: Complications 2/2 to colonoscopy High Risk (Colonoscopy): genetic disorder (Lynch syndrome or familial adenomatous polyposis), personal hx of IBD, previous adenomatous polyp, or previous colorectal cancer, FamHx start 10 years before the age at diagnosis, increased in males and black race  Options:  FIT - looks for hemoglobin (blood in the stool) - specific and fairly sensitive - must be done annually Cologuard - looks for DNA and blood - more sensitive - therefore can have more false  positives, every 3 years Colonoscopy - every 10 years if normal - sedation, bowl prep, must have someone drive you  Shared decision making and the patient had decided to do colonoscopy.   Social History   Tobacco Use  Smoking Status Never Smoker  Smokeless Tobacco Never Used    Lung Cancer Screening (Ages 23-30): not applicable     Past Medical History:  Diagnosis Date  . Obesity    compulsive overeater    Past Surgical History:  Procedure Laterality Date  . CHOLECYSTECTOMY    . TONSILLECTOMY    . VASECTOMY      Prior to Admission medications   Medication Sig Start Date End Date Taking? Authorizing Provider  tamsulosin (FLOMAX) 0.4 MG CAPS capsule Take 1 capsule (0.4 mg total) by mouth daily. Patient taking differently: Take 0.8 mg by mouth daily. 10/14/20  Yes Lesleigh Noe, MD    Allergies  Allergen Reactions  . Penicillins      Social History   Socioeconomic History  . Marital status: Married    Spouse name: Rise Paganini  . Number of children: 2  . Years of education: masthers  . Highest education level: Not on file  Occupational History  . Occupation: Mining engineer: [employer]  Tobacco Use  . Smoking status: Never Smoker  . Smokeless tobacco: Never Used  Substance and Sexual Activity  . Alcohol use: Yes    Comment: rare, 1 glass of wine occasionally  . Drug use: No  . Sexual activity: Not Currently    Birth control/protection: Surgical  Other Topics Concern  . 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   Living: with wife Rise Paganini   Work: Theme park manager at [employer]      Family: adult children - Ria Comment and Thurmond Butts - 2 grandchildren (in [place])       Enjoys: bicycling and spending time with grandchildren      Exercise: has home equipment - doing some exercise on and off   Diet: when on the program - veggies/proteins - no flour/sugar      Safety   Seat  belts: Yes    Guns: No   Safe in relationships: Yes    Social Determinants of Radio broadcast assistant Strain: Not on file  Food Insecurity: Not on file  Transportation Needs: Not on file  Physical Activity: Not on file  Stress: Not on file  Social Connections: Not on file  Intimate Partner Violence: Not on file    Family History  Problem Relation Age of Onset  . Stroke Mother 29  . Transient ischemic attack Mother   . Diabetes Mother   . Stroke Father 62  . Hypertension Father   . Pancreatic cancer Maternal Uncle 65  . Rheum arthritis Paternal Aunt   . Epilepsy Paternal Uncle   .  Throat cancer Paternal Uncle   . Stroke Maternal Grandmother   . Stroke Paternal Grandmother     Review of Systems  Constitutional: Negative for chills and fever.  HENT: Negative for congestion and sore throat.   Eyes: Negative for blurred vision and double vision.  Respiratory: Negative for shortness of breath.   Cardiovascular: Negative for chest pain.  Gastrointestinal: Negative for heartburn, nausea and vomiting.  Genitourinary: Negative.   Musculoskeletal: Negative.  Negative for myalgias.  Skin: Negative for rash.  Neurological: Negative for dizziness and headaches.  Endo/Heme/Allergies: Does not bruise/bleed easily.  Psychiatric/Behavioral: Negative for depression. The patient is not nervous/anxious.      Physical Exam BP 122/62   Pulse 78   Temp 98.2 F (36.8 C) (Temporal)   Ht 6' (1.829 m)   Wt 272 lb 12 oz (123.7 kg)   SpO2 100%   BMI 36.99 kg/m    BP Readings from Last 3 Encounters:  10/21/20 122/62  09/17/19 122/72  05/18/18 126/74      Physical Exam Constitutional:      General: He is not in acute distress.    Appearance: He is well-developed. He is not diaphoretic.  HENT:     Head: Normocephalic and atraumatic.     Right Ear: Tympanic membrane and ear canal normal.     Left Ear: Tympanic membrane and ear canal normal.     Nose: Nose normal.      Mouth/Throat:     Pharynx: Uvula midline.  Eyes:     General: No scleral icterus.    Conjunctiva/sclera: Conjunctivae normal.     Pupils: Pupils are equal, round, and reactive to light.  Cardiovascular:     Rate and Rhythm: Normal rate and regular rhythm.     Heart sounds: Normal heart sounds. No murmur heard.   Pulmonary:     Effort: Pulmonary effort is normal. No respiratory distress.     Breath sounds: Normal breath sounds. No wheezing.  Abdominal:     General: Bowel sounds are normal. There is no distension.     Palpations: Abdomen is soft. There is no mass.     Tenderness: There is no abdominal tenderness. There is no guarding.  Musculoskeletal:        General: Normal range of motion.     Cervical back: Normal range of motion and neck supple.     Comments: Right hip FABER w pain on the lateral hip/thigh  Lymphadenopathy:     Cervical: No cervical adenopathy.  Skin:    General: Skin is warm and dry.     Capillary Refill: Capillary refill takes less than 2 seconds.  Neurological:     Mental Status: He is alert and oriented to person, place, and time.        Results:  PHQ-9:   Depression screen [facility] 2/9 03/22/2018 12/02/2015  Decreased Interest 0 0  Down, Depressed, Hopeless 0 0  PHQ - 2 Score 0 0      Assessment: 65 y.o. here for routine annual physical examination.  Plan: Problem List Items Addressed This Visit      Genitourinary   BPH (benign prostatic hyperplasia)    Pt with mixed urinary picture - concern for possible bladder overactivity as well as BPH. Urology referral      Relevant Orders   Ambulatory referral to Urology     Other   MORBID OBESITY    Encouraged continued weight loss efforts with healthy diet and exercise  Relevant Orders   Comprehensive metabolic panel   Hemoglobin A1c   HLD (hyperlipidemia)    ASCVD on prior labs high risk. Discussed starting statin will recheck today but if still elevated he will start atorvastatin       Relevant Medications   atorvastatin (LIPITOR) 10 MG tablet   Other Relevant Orders   Lipid panel   Right hip pain    Lateral hip pain. Advised glute and thigh strengthening. Continue stationary bike. Lower concern for arthritis given exam today.        Other Visit Diagnoses    Annual physical exam    -  Primary   Relevant Orders   Comprehensive metabolic panel   Lipid panel   Hemoglobin A1c   Screening for colon cancer       Relevant Orders   Ambulatory referral to Gastroenterology   Prostate cancer screening       Relevant Orders   PSA   Need for shingles vaccine       Relevant Orders   Administer Zoster, Recombinant (Shingrix) Vaccine   Screening for diabetes mellitus       Relevant Orders   Hemoglobin A1c      Screening: -- Blood pressure screen normal -- cholesterol screening: will obtain -- Weight screening: obese: discussed management options, including lifestyle, dietary, and exercise. -- Diabetes Screening: will obtain -- Nutrition: normal - Encouraged healthy diet  The 10-year ASCVD risk score Mikey Bussing DC Jr., et al., 2013) is: 8.3%   Values used to calculate the score:     Age: 68 years     Sex: Male     Is Non-Hispanic African American: No     Diabetic: No     Tobacco smoker: No     Systolic Blood Pressure: 025 mmHg     Is BP treated: No     HDL Cholesterol: 58.4 mg/dL     Total Cholesterol: 197 mg/dL  -- ASA 81 mg discussed if CVD risk >10% age 108-59 and willing to take for 10 years -- Statin therapy for Age 6-75 with CVD risk >7.5%  Psych -- Depression screening (PHQ-9): positive  Safety -- tobacco screening: not using -- alcohol screening:  low-risk usage. -- no evidence of domestic violence or intimate partner violence.  Cancer Screening -- Prostate (age 64-69) ordered -- Colon (age 59-75) referral placed -- Lung not indicated   Immunizations Immunization History  Administered Date(s) Administered  . Influenza,inj,Quad PF,6+ Mos  06/23/2020  . PFIZER(Purple Top)SARS-COV-2 Vaccination 09/19/2019, 10/10/2019, 06/23/2020  . Tdap 11/02/2011    -- flu vaccine up to date -- TDAP q10 years up to date -- Shingles (age >50) up to date -- Covid-19 Vaccine up to date  Encouraged regular vision and dental screening. Encouraged healthy exercise and diet.   Lesleigh Noe

## 2020-10-21 NOTE — Assessment & Plan Note (Signed)
Pt with mixed urinary picture - concern for possible bladder overactivity as well as BPH. Urology referral

## 2020-10-21 NOTE — Assessment & Plan Note (Signed)
ASCVD on prior labs high risk. Discussed starting statin will recheck today but if still elevated he will start atorvastatin

## 2020-10-21 NOTE — Patient Instructions (Signed)
#  Referral I have placed a referral to a specialist for you. You should receive a phone call from the specialty office. Make sure your voicemail is not full and that if you are able to answer your phone to unknown or new numbers.   It may take up to 2 weeks to hear about the referral. If you do not hear anything in 2 weeks, please call our office and ask to speak with the referral coordinator.    Will follow-up labs but plan to start cholesterol medication

## 2020-10-21 NOTE — Assessment & Plan Note (Signed)
Encouraged continued weight loss efforts with healthy diet and exercise

## 2020-10-21 NOTE — Assessment & Plan Note (Signed)
Lateral hip pain. Advised glute and thigh strengthening. Continue stationary bike. Lower concern for arthritis given exam today.

## 2020-10-24 ENCOUNTER — Encounter: Payer: Self-pay | Admitting: Family Medicine

## 2020-10-24 DIAGNOSIS — N401 Enlarged prostate with lower urinary tract symptoms: Secondary | ICD-10-CM

## 2020-10-27 MED ORDER — TAMSULOSIN HCL 0.4 MG PO CAPS: 0.8000 mg | ORAL_CAPSULE | Freq: Every day | ORAL | Status: DC

## 2020-10-28 ENCOUNTER — Other Ambulatory Visit: Payer: Self-pay

## 2020-10-28 DIAGNOSIS — Z1211 Encounter for screening for malignant neoplasm of colon: Secondary | ICD-10-CM

## 2020-10-28 MED ORDER — GOLYTELY 236 G PO SOLR: 4000.0000 mL | Freq: Once | ORAL | 0 refills | Status: AC

## 2020-11-04 NOTE — Progress Notes (Addendum)
11/06/2020  6:10 PM   Riley Peterson [birth year] [id number]  Referring provider: Lesleigh Noe, MD [place],  [place] [address] Chief Complaint  Patient presents with  . Benign Prostatic Hypertrophy     HPI: Riley Peterson is a 65 y.o. male who presents today for further evaluation of urinary symptoms.  Patient was last seen by Dr. Waunita Schooner (PCP) on 10/21/2020 when she wrote that the patient had a mixed urinary picture, and that there was a concern for possible OAB and BPH. Patient complained of urgency and incontinence. Patient is taking flomax, 0.8 mg per day (double his original dosage), without much relief.  He reports that his symptoms are severe both obstructive and irritative in nature.  He is mostly bothered by nocturia for which he gets up sometimes as much as once per hour.  He is not getting sleep and is very fatigued.  He also feels like he has difficulty emptying his bladder.  The symptoms have been going on for years but it progressed over the past several months.  He is very bothered by this.  No personal history of urinary retention or urinary tract infections.  He denies a family history of prostate cancer but reports that his dad did have prostate issues and ended up having what sounds like a TURP in the past.  Never smoker.   PSA has also been slowly rising.  It is high as 4.16 slowly rising from 2.9 four years ago.      IPSS    Row Name 11/05/20 0800         International Prostate Symptom Score   How often have you had the sensation of not emptying your bladder? More than half the time     How often have you had to urinate less than every two hours? Almost always     How often have you found you stopped and started again several times when you urinated? Less than half the time     How often have you found it difficult to postpone urination? More than half the time     How often have you had a weak urinary stream? Almost always     How  often have you had to strain to start urination? More than half the time     How many times did you typically get up at night to urinate? 5 Times     Total IPSS Score 29           Quality of Life due to urinary symptoms   If you were to spend the rest of your life with your urinary condition just the way it is now how would you feel about that? Mostly Disatisfied           Score:  1-7 Mild 8-19 Moderate 20-35 Severe   PMH: Past Medical History:  Diagnosis Date  . Obesity    compulsive overeater    Surgical History: Past Surgical History:  Procedure Laterality Date  . CHOLECYSTECTOMY    . KIDNEY STONE SURGERY    . TONSILLECTOMY    . VASECTOMY      Home Medications:  Allergies as of 11/05/2020      Reactions   Penicillins       Medication List       Accurate as of November 05, 2020 11:59 PM. If you have any questions, ask your nurse or doctor.        atorvastatin 10 MG  tablet Commonly known as: LIPITOR Take 1 tablet (10 mg total) by mouth daily.   tamsulosin 0.4 MG Caps capsule Commonly known as: FLOMAX Take 2 capsules (0.8 mg total) by mouth daily.       Allergies: Allergies  Allergen Reactions  . Penicillins     Family History: Family History  Problem Relation Age of Onset  . Stroke Mother 42  . Transient ischemic attack Mother   . Diabetes Mother   . Stroke Father 42  . Hypertension Father   . Pancreatic cancer Maternal Uncle 30  . Rheum arthritis Paternal Aunt   . Epilepsy Paternal Uncle   . Throat cancer Paternal Uncle   . Stroke Maternal Grandmother   . Stroke Paternal Grandmother     Social History:   reports that he has never smoked. He has never used smokeless tobacco. He reports current alcohol use. He reports that he does not use drugs.  ROS: Pertinent ROS in HPI.  Physical Exam: BP 133/84   Pulse 73   Ht 6' (1.829 m)   Wt 273 lb (123.8 kg)   BMI 37.03 kg/m   Constitutional:  Alert and oriented, No acute distress. HEENT:  [place] AT, moist mucus membranes.  Trachea midline, no masses. Cardiovascular: No clubbing, cyanosis, or edema. Respiratory: Normal respiratory effort, no increased work of breathing. Rectal: Prostate is 50+ grams, no nodules, non-tender, with normal sphincter tone. Skin: No rashes, bruises or suspicious lesions. Neurologic: Grossly intact, no focal deficits, moving all 4 extremities. Psychiatric: Normal mood and affect.  Laboratory Data:  Lab Results  Component Value Date   CREATININE 0.78 10/21/2020    Lab Results  Component Value Date   PSA 4.16 (H) 10/21/2020   PSA 3.79 03/22/2018   PSA 2.90 11/24/2015     Lab Results  Component Value Date   HGBA1C 5.3 10/21/2020   Urinalysis Results for orders placed or performed in visit on 11/05/20  Microscopic Examination   Urine  Result Value Ref Range   WBC, UA 0-5 0 - 5 /hpf   RBC 0-2 0 - 2 /hpf   Epithelial Cells (non renal) 0-10 0 - 10 /hpf   Bacteria, UA None seen None seen/Few  Urinalysis, Complete  Result Value Ref Range   Specific Gravity, UA 1.020 1.005 - 1.030   pH, UA 5.5 5.0 - 7.5   Color, UA Yellow Yellow   Appearance Ur Clear Clear   Leukocytes,UA Negative Negative   Protein,UA Negative Negative/Trace   Glucose, UA Negative Negative   Ketones, UA Negative Negative   RBC, UA Negative Negative   Bilirubin, UA Negative Negative   Urobilinogen, Ur 0.2 0.2 - 1.0 mg/dL   Nitrite, UA Negative Negative   Microscopic Examination See below:   Bladder Scan (Post Void Residual) in office  Result Value Ref Range   Scan Result 135     I have reviewed the labs.   I have personally reviewed the images and agree with radiologist interpretation.    Assessment & Plan:   Results for orders placed or performed in visit on 11/05/20  Microscopic Examination   Urine  Result Value Ref Range   WBC, UA 0-5 0 - 5 /hpf   RBC 0-2 0 - 2 /hpf   Epithelial Cells (non renal) 0-10 0 - 10 /hpf   Bacteria, UA None seen None seen/Few   Urinalysis, Complete  Result Value Ref Range   Specific Gravity, UA 1.020 1.005 - 1.030   pH, UA 5.5  5.0 - 7.5   Color, UA Yellow Yellow   Appearance Ur Clear Clear   Leukocytes,UA Negative Negative   Protein,UA Negative Negative/Trace   Glucose, UA Negative Negative   Ketones, UA Negative Negative   RBC, UA Negative Negative   Bilirubin, UA Negative Negative   Urobilinogen, Ur 0.2 0.2 - 1.0 mg/dL   Nitrite, UA Negative Negative   Microscopic Examination See below:   Bladder Scan (Post Void Residual) in office  Result Value Ref Range   Scan Result 135     1. Benign prostatic hyperplasia with urinary frequency Severe urinary which are poorly controlled on current regimen of Flomax  Prostate is enlarged on rectal exam and is also had a rise in his PSA as outlined below  At this point, he may consider an outlet procedure.  We discussed this in some detail today.  At this point time, I recommend cystoscopy as well as prostate sizing to help further evaluate his prostatic and bladder anatomy/health.  He is agreeable this plan.  Continue Flomax for the time being - Urinalysis, Complete - Bladder Scan (Post Void Residual) in office  2. Nocturia Possibly multifactorial however #1 a certainly contributing factor, will address as above  Discussed behavioral modification  3. Incomplete bladder emptying Mildly elevated PVR today, will continue to follow and treat underlying BPH, suspect secondary to chronic outlet obstruction  4. Elevated PSA Elevated/rising PSA over the past several years  Given his overall PSA velocity, I do think it important to rule out prostate cancer  We discussed prostate biopsy in detail including the procedure itself, the risks of blood in the urine, stool, and ejaculate, serious infection, and discomfort. He is willing to proceed with this as discussed.   Follow Up: Cysto/ probstate biopsy  [facility] [address]  [place], [place] [address] [phone]

## 2020-11-05 ENCOUNTER — Other Ambulatory Visit: Payer: Self-pay

## 2020-11-05 ENCOUNTER — Ambulatory Visit: Payer: BC Managed Care – PPO | Admitting: Urology

## 2020-11-05 ENCOUNTER — Encounter: Payer: Self-pay | Admitting: Urology

## 2020-11-05 VITALS — BP 133/84 | HR 73 | Ht 72.0 in | Wt 273.0 lb

## 2020-11-05 DIAGNOSIS — R351 Nocturia: Secondary | ICD-10-CM | POA: Diagnosis not present

## 2020-11-05 DIAGNOSIS — N401 Enlarged prostate with lower urinary tract symptoms: Secondary | ICD-10-CM

## 2020-11-05 DIAGNOSIS — R350 Frequency of micturition: Secondary | ICD-10-CM | POA: Diagnosis not present

## 2020-11-05 DIAGNOSIS — R339 Retention of urine, unspecified: Secondary | ICD-10-CM | POA: Diagnosis not present

## 2020-11-05 DIAGNOSIS — R972 Elevated prostate specific antigen [PSA]: Secondary | ICD-10-CM

## 2020-11-05 LAB — BLADDER SCAN AMB NON-IMAGING: Scan Result: 135

## 2020-11-05 NOTE — Patient Instructions (Signed)

## 2020-11-06 LAB — MICROSCOPIC EXAMINATION: Bacteria, UA: NONE SEEN

## 2020-11-06 LAB — URINALYSIS, COMPLETE
Bilirubin, UA: NEGATIVE
Glucose, UA: NEGATIVE
Ketones, UA: NEGATIVE
Leukocytes,UA: NEGATIVE
Nitrite, UA: NEGATIVE
Protein,UA: NEGATIVE
RBC, UA: NEGATIVE
Specific Gravity, UA: 1.02 (ref 1.005–1.030)
Urobilinogen, Ur: 0.2 mg/dL (ref 0.2–1.0)
pH, UA: 5.5 (ref 5.0–7.5)

## 2020-11-24 NOTE — Progress Notes (Signed)
    11/26/20  CC:  Chief Complaint  Patient presents with  . Prostate Biopsy    HPI: 65 year old male with poorly controlled urinary symptoms who presents today for cystoscopy and prostate biopsy.  Please see notes for previous details.  In the interim, he has had no improvement with Flomax.  Blood pressure 130/80, pulse 71, height 6' (1.829 m), weight 273 lb (123.8 kg). NED. A&Ox3.   No respiratory distress   Abd soft, NT, ND Normal phallus with bilateral descended testicles  Cystoscopy Procedure Note  Patient identification was confirmed, informed consent was obtained, and patient was prepped using Betadine solution.  Lidocaine jelly was administered per urethral meatus.     Pre-Procedure: - Inspection reveals a normal caliber ureteral meatus.  Procedure: The flexible cystoscope was introduced without difficulty - No urethral strictures/lesions are present. - Enlarged prostate significant trilobar coaptation, 6+ cm - Elevated bladder neck - Bilateral ureteral orifices identified - Bladder mucosa  reveals no ulcers, tumors, or lesions - No bladder stones - Moderate trabeculation  Retroflexion shows a discrete median lobe as well as kissing lateral lobes with significant intravesical protrusion   Post-Procedure: - Patient tolerated the procedure well   Prostate Biopsy Procedure   Informed consent was obtained after discussing risks/benefits of the procedure.  A time out was performed to ensure correct patient identity.  -Ceftriaxone 1 g given prophylactically - Levaquin 500 mg administered PO -Transrectal Ultrasound performed revealing a 213.8 gm prostate -No significant hypoechoic or median lobe noted  Procedure: - Prostate block performed using 10 cc 1% lidocaine and biopsies taken from sextant areas, a total of 12 under ultrasound guidance.  Post-Procedure: - Patient tolerated the procedure well - He was counseled to seek immediate medical attention if  experiences any severe pain, significant bleeding, or fevers - Return in one week to discuss biopsy results as well as discussion of consideration of outlet procedure, primarily holep based on the size of his gland.  He was given some written information on this today.

## 2020-11-26 ENCOUNTER — Other Ambulatory Visit: Payer: Self-pay

## 2020-11-26 ENCOUNTER — Encounter: Payer: Self-pay | Admitting: Urology

## 2020-11-26 ENCOUNTER — Ambulatory Visit: Payer: BC Managed Care – PPO | Admitting: Urology

## 2020-11-26 VITALS — BP 130/80 | HR 71 | Ht 72.0 in | Wt 273.0 lb

## 2020-11-26 DIAGNOSIS — R350 Frequency of micturition: Secondary | ICD-10-CM

## 2020-11-26 DIAGNOSIS — N401 Enlarged prostate with lower urinary tract symptoms: Secondary | ICD-10-CM

## 2020-11-26 MED ORDER — CEFTRIAXONE SODIUM 1 G IJ SOLR
1.0000 g | Freq: Once | INTRAMUSCULAR | Status: AC
Start: 2020-11-26 — End: 2020-11-26
  Administered 2020-11-26: 1 g via INTRAMUSCULAR

## 2020-11-26 MED ORDER — LEVOFLOXACIN 500 MG PO TABS
500.0000 mg | ORAL_TABLET | Freq: Once | ORAL | Status: AC
  Administered 2020-11-26: 500 mg via ORAL

## 2020-11-26 MED ORDER — TAMSULOSIN HCL 0.4 MG PO CAPS: 0.8000 mg | ORAL_CAPSULE | Freq: Every day | ORAL | 0 refills | Status: DC

## 2020-11-27 ENCOUNTER — Telehealth: Payer: Self-pay | Admitting: *Deleted

## 2020-11-27 LAB — SURGICAL PATHOLOGY

## 2020-11-27 NOTE — Telephone Encounter (Addendum)
Patient informed, voiced understanding.  ----- Message from Hollice Espy, MD sent at 11/27/2020 12:23 PM EDT ----- Doristine Devoid news, his prostate biopsy came back negative for malignancy cancer.    Please have him follow-up next week as scheduled to discuss how to manage his massively enlarged prostate and urinary symptoms.  Hollice Espy, MD

## 2020-11-29 ENCOUNTER — Other Ambulatory Visit: Payer: Self-pay | Admitting: Family Medicine

## 2020-11-29 DIAGNOSIS — R350 Frequency of micturition: Secondary | ICD-10-CM

## 2020-11-29 DIAGNOSIS — N401 Enlarged prostate with lower urinary tract symptoms: Secondary | ICD-10-CM

## 2020-12-02 MED ORDER — TAMSULOSIN HCL 0.4 MG PO CAPS: 0.8000 mg | ORAL_CAPSULE | Freq: Every day | ORAL | 0 refills | Status: DC

## 2020-12-02 NOTE — Addendum Note (Signed)
Addended by: Lurlean Nanny on: 12/02/2020 09:22 AM   Modules accepted: Orders

## 2020-12-03 ENCOUNTER — Other Ambulatory Visit: Payer: Self-pay

## 2020-12-03 ENCOUNTER — Encounter: Payer: Self-pay | Admitting: Urology

## 2020-12-03 ENCOUNTER — Ambulatory Visit (INDEPENDENT_AMBULATORY_CARE_PROVIDER_SITE_OTHER): Payer: BC Managed Care – PPO | Admitting: Urology

## 2020-12-03 VITALS — BP 122/76 | HR 71 | Ht 72.0 in | Wt 264.0 lb

## 2020-12-03 DIAGNOSIS — R339 Retention of urine, unspecified: Secondary | ICD-10-CM | POA: Diagnosis not present

## 2020-12-03 DIAGNOSIS — R350 Frequency of micturition: Secondary | ICD-10-CM | POA: Diagnosis not present

## 2020-12-03 DIAGNOSIS — R972 Elevated prostate specific antigen [PSA]: Secondary | ICD-10-CM

## 2020-12-03 DIAGNOSIS — N401 Enlarged prostate with lower urinary tract symptoms: Secondary | ICD-10-CM | POA: Diagnosis not present

## 2020-12-03 MED ORDER — FINASTERIDE 5 MG PO TABS: 5.0000 mg | ORAL_TABLET | Freq: Every day | ORAL | 3 refills | Status: DC

## 2020-12-03 NOTE — Patient Instructions (Signed)

## 2020-12-03 NOTE — Progress Notes (Signed)
12/03/2020 4:28 PM   Riley Peterson Oct 17, 1955 [id number]  Referring provider: Lesleigh Noe, MD [place],  [place] [address]  Chief Complaint  Patient presents with  . Other    HPI: 65 year old male with personal history of BPH and elevated PSA returns today to discuss his biopsy results as well as prostamegaly.  He initially presented with an elevated PSA which has been risen slowly up to 4.16.  At the time of prostate biopsy, cystoscopy was performed which showed moderately trabeculated bladder and massive prostamegaly with significant obstruction.  TRUS 213.8 cc with median lobe  Prostate biopsy without evidence of malignancy.  He is currently on Flomax with poorly controlled urinary symptoms both obstructive and irritative.  Previous IPSS 29/5.  If he does not take his Flomax, he can barely void.   PMH: Past Medical History:  Diagnosis Date  . Elevated PSA   . Hyperlipidemia   . Obesity    compulsive overeater    Surgical History: Past Surgical History:  Procedure Laterality Date  . CHOLECYSTECTOMY    . KIDNEY STONE SURGERY    . TONSILLECTOMY    . VASECTOMY      Home Medications:  Allergies as of 12/03/2020      Reactions   Penicillins       Medication List       Accurate as of Dec 03, 2020  4:28 PM. If you have any questions, ask your nurse or doctor.        atorvastatin 10 MG tablet Commonly known as: LIPITOR Take 1 tablet (10 mg total) by mouth daily.   tamsulosin 0.4 MG Caps capsule Commonly known as: FLOMAX Take 2 capsules (0.8 mg total) by mouth daily.       Allergies:  Allergies  Allergen Reactions  . Penicillins     Family History: Family History  Problem Relation Age of Onset  . Stroke Mother 92  . Transient ischemic attack Mother   . Diabetes Mother   . Stroke Father 32  . Hypertension Father   . Pancreatic cancer Maternal Uncle 34  . Rheum arthritis Paternal Aunt   . Epilepsy Paternal Uncle   . Throat  cancer Paternal Uncle   . Stroke Maternal Grandmother   . Stroke Paternal Grandmother     Social History:  reports that he has never smoked. He has never used smokeless tobacco. He reports current alcohol use. He reports that he does not use drugs.   Physical Exam: BP 122/76   Pulse 71   Ht 6' (1.829 m)   Wt 264 lb (119.7 kg)   BMI 35.80 kg/m   Constitutional:  Alert and oriented, No acute distress. HEENT:  AT, moist mucus membranes.  Trachea midline, no masses. Cardiovascular: No clubbing, cyanosis, or edema. Respiratory: Normal respiratory effort, no increased work of breathing. Skin: No rashes, bruises or suspicious lesions. Neurologic: Grossly intact, no focal deficits, moving all 4 extremities. Psychiatric: Normal mood and affect.  Laboratory Data: Lab Results  Component Value Date   WBC 6.8 11/24/2015   HGB 14.4 11/24/2015   HCT 42.7 11/24/2015   MCV 85.9 11/24/2015   PLT 269.0 11/24/2015    Lab Results  Component Value Date   CREATININE 0.78 10/21/2020    Lab Results  Component Value Date   PSA 4.16 (H) 10/21/2020   PSA 3.79 03/22/2018   PSA 2.90 11/24/2015    Assessment & Plan:    1. Benign prostatic hyperplasia with urinary  frequency Currently on Flomax with poorly controlled symptoms  Cystoscopic and transrectal ultrasound findings were discussed with the patient, evidence of massive prostamegaly cystoscopic sequela of chronic outlet obstruction  I would strongly recommend consideration of outlet procedure both for alleviation of his urinary symptoms as well as bladder preservation purposes.  Given the gland size, various procedures including open simple prostatectomy, robotic simple prostatectomy versus holmium laser enucleation of the prostate were discussed.  We reviewed the surgery in detail today including the preoperative, intraoperative, and postoperative course.  This will most likely be an outpatient procedure pending the degree of post op  hematuria.  He will go home with catheter for a few days post op and will either be taught how to remove his own catheter or return to the office for catheter removal.  Risk of bleeding, infection, damage surrounding structures, injury to the bladder/ urethral, bladder neck contracture, ureteral stricture, retrograde ejaculation, stress/ urge incontinence, exacerbation of irritative voiding symptoms were all discussed in detail.    He has some constraints based on his schedule this summer.  He like to tentatively schedule this for mid July when he returned from a camp.  In the interim, he would like to consider finasteride therapy in addition to his Flomax.  This may alleviate some of his urinary symptoms but may also help reduce intraoperative bleeding.  As such, this prescription was sent to pharmacy.  We will likely try to stop all of his BPH medicines once he is recovered from his outlet procedure.  2. Incomplete bladder emptying Secondary to #1  3. Elevated PSA PSA is likely appropriate for gland size, negative prostate biopsy reassuring, discussed surgical pathology   Hollice Espy, MD  [facility] [address] [place], [place] [address] [phone]

## 2020-12-08 ENCOUNTER — Other Ambulatory Visit: Payer: Self-pay | Admitting: Urology

## 2020-12-08 DIAGNOSIS — N401 Enlarged prostate with lower urinary tract symptoms: Secondary | ICD-10-CM

## 2020-12-08 DIAGNOSIS — R350 Frequency of micturition: Secondary | ICD-10-CM

## 2020-12-24 HISTORY — PX: PROSTATE BIOPSY: SHX241

## 2021-01-05 ENCOUNTER — Other Ambulatory Visit: Payer: BC Managed Care – PPO

## 2021-01-07 ENCOUNTER — Ambulatory Visit: Payer: BC Managed Care – PPO | Admitting: Anesthesiology

## 2021-01-07 ENCOUNTER — Encounter
Admission: RE | Disposition: A | Discharge status: Home / Self Care | Payer: Self-pay | Source: Ambulatory Visit | Attending: Gastroenterology

## 2021-01-07 ENCOUNTER — Encounter: Payer: Self-pay | Admitting: Gastroenterology

## 2021-01-07 ENCOUNTER — Ambulatory Visit
Admission: RE | Admit: 2021-01-07 | Discharge: 2021-01-07 | Disposition: A | Discharge status: Home / Self Care | Payer: BC Managed Care – PPO | Source: Ambulatory Visit | Attending: Gastroenterology | Admitting: Gastroenterology

## 2021-01-07 DIAGNOSIS — Z79899 Other long term (current) drug therapy: Secondary | ICD-10-CM | POA: Insufficient documentation

## 2021-01-07 DIAGNOSIS — Q438 Other specified congenital malformations of intestine: Secondary | ICD-10-CM | POA: Diagnosis not present

## 2021-01-07 DIAGNOSIS — Z880 Allergy status to penicillin: Secondary | ICD-10-CM | POA: Diagnosis not present

## 2021-01-07 DIAGNOSIS — Z1211 Encounter for screening for malignant neoplasm of colon: Secondary | ICD-10-CM | POA: Diagnosis present

## 2021-01-07 DIAGNOSIS — K644 Residual hemorrhoidal skin tags: Secondary | ICD-10-CM | POA: Insufficient documentation

## 2021-01-07 DIAGNOSIS — Z6835 Body mass index (BMI) 35.0-35.9, adult: Secondary | ICD-10-CM | POA: Diagnosis not present

## 2021-01-07 DIAGNOSIS — K635 Polyp of colon: Secondary | ICD-10-CM | POA: Diagnosis not present

## 2021-01-07 DIAGNOSIS — E669 Obesity, unspecified: Secondary | ICD-10-CM | POA: Diagnosis not present

## 2021-01-07 DIAGNOSIS — D123 Benign neoplasm of transverse colon: Secondary | ICD-10-CM | POA: Diagnosis not present

## 2021-01-07 HISTORY — PX: COLONOSCOPY WITH PROPOFOL: SHX5780

## 2021-01-07 SURGERY — COLONOSCOPY WITH PROPOFOL
Anesthesia: General

## 2021-01-07 MED ORDER — SODIUM CHLORIDE 0.9 % IV SOLN: INTRAVENOUS | Status: DC

## 2021-01-07 MED ORDER — PROPOFOL 500 MG/50ML IV EMUL
INTRAVENOUS | Status: DC | PRN
  Administered 2021-01-07: 120 ug/kg/min via INTRAVENOUS

## 2021-01-07 MED ORDER — LIDOCAINE HCL (PF) 2 % IJ SOLN
INTRAMUSCULAR | Status: AC
  Filled 2021-01-07: qty 5

## 2021-01-07 MED ORDER — LIDOCAINE 2% (20 MG/ML) 5 ML SYRINGE
INTRAMUSCULAR | Status: DC | PRN
  Administered 2021-01-07: 25 mg via INTRAVENOUS

## 2021-01-07 MED ORDER — PROPOFOL 10 MG/ML IV BOLUS
INTRAVENOUS | Status: DC | PRN
  Administered 2021-01-07: 70 mg via INTRAVENOUS

## 2021-01-07 MED ORDER — PROPOFOL 500 MG/50ML IV EMUL
INTRAVENOUS | Status: AC
  Filled 2021-01-07: qty 50

## 2021-01-07 MED ORDER — MIDAZOLAM HCL 2 MG/2ML IJ SOLN
INTRAMUSCULAR | Status: AC
  Filled 2021-01-07: qty 2

## 2021-01-07 MED ORDER — MIDAZOLAM HCL 5 MG/5ML IJ SOLN
INTRAMUSCULAR | Status: DC | PRN
  Administered 2021-01-07: 2 mg via INTRAVENOUS

## 2021-01-07 NOTE — Anesthesia Postprocedure Evaluation (Signed)
Anesthesia Post Note  Patient: Riley Peterson  Procedure(s) Performed: COLONOSCOPY WITH PROPOFOL  Patient location during evaluation: Phase II Anesthesia Type: General Level of consciousness: awake and alert, awake and oriented Pain management: pain level controlled Vital Signs Assessment: post-procedure vital signs reviewed and stable Respiratory status: spontaneous breathing, nonlabored ventilation and respiratory function stable Cardiovascular status: blood pressure returned to baseline and stable Postop Assessment: no apparent nausea or vomiting Anesthetic complications: no   No notable events documented.   Last Vitals:  Vitals:   01/07/21 1030 01/07/21 1040  BP: 120/79 116/81  Pulse: 63 63  Resp: 14 17  Temp:    SpO2: 100% 100%    Last Pain:  Vitals:   01/07/21 1010  TempSrc: Temporal  PainSc:                  Phill Mutter

## 2021-01-07 NOTE — Op Note (Signed)
[facility] Gastroenterology Patient Name: Riley Peterson Procedure Date: 01/07/2021 9:33 AM MRN: [id number] Account #: [id number] Date of Birth: Aug 29, 1955 Admit Type: Outpatient Age: 65 Room: [facility] ENDO ROOM 3 Gender: Male Note Status: Finalized Procedure:             Colonoscopy Indications:           Screening for colorectal malignant neoplasm, Last                         colonoscopy 10 years ago Providers:             Lin Landsman MD, MD Referring MD:          Jobe Marker. Einar Pheasant (Referring MD) Medicines:             General Anesthesia Complications:         No immediate complications. Estimated blood loss: None. Procedure:             Pre-Anesthesia Assessment:                        - Prior to the procedure, a History and Physical was                         performed, and patient medications and allergies were                         reviewed. The patient is competent. The risks and                         benefits of the procedure and the sedation options and                         risks were discussed with the patient. All questions                         were answered and informed consent was obtained.                         Patient identification and proposed procedure were                         verified by the physician, the nurse, the                         anesthesiologist, the anesthetist and the technician                         in the pre-procedure area in the procedure room in the                         endoscopy suite. Mental Status Examination: alert and                         oriented. Airway Examination: normal oropharyngeal                         airway and neck mobility. Respiratory Examination:  clear to auscultation. CV Examination: normal.                         Prophylactic Antibiotics: The patient does not require                         prophylactic antibiotics. Prior Anticoagulants: The                          patient has taken no previous anticoagulant or                         antiplatelet agents. ASA Grade Assessment: II - A                         patient with mild systemic disease. After reviewing                         the risks and benefits, the patient was deemed in                         satisfactory condition to undergo the procedure. The                         anesthesia plan was to use general anesthesia.                         Immediately prior to administration of medications,                         the patient was re-assessed for adequacy to receive                         sedatives. The heart rate, respiratory rate, oxygen                         saturations, blood pressure, adequacy of pulmonary                         ventilation, and response to care were monitored                         throughout the procedure. The physical status of the                         patient was re-assessed after the procedure.                        After obtaining informed consent, the colonoscope was                         passed under direct vision. Throughout the procedure,                         the patient's blood pressure, pulse, and oxygen                         saturations were monitored continuously. The  Colonoscope was introduced through the anus and                         advanced to the the cecum, identified by appendiceal                         orifice and ileocecal valve. The colonoscopy was                         performed with moderate difficulty due to inadequate                         bowel prep, significant looping and the patient's body                         habitus. Successful completion of the procedure was                         aided by applying abdominal pressure. The patient                         tolerated the procedure well. The quality of the bowel                         preparation was evaluated using the BBPS  [facility] Bowel                         Preparation Scale) with scores of: Right Colon = 2                         (minor amount of residual staining, small fragments of                         stool and/or opaque liquid, but mucosa seen well),                         Transverse Colon = 2 (minor amount of residual                         staining, small fragments of stool and/or opaque                         liquid, but mucosa seen well) and Left Colon = 2                         (minor amount of residual staining, small fragments of                         stool and/or opaque liquid, but mucosa seen well). The                         total BBPS score equals 6. Findings:      Skin tags were found on perianal exam.      A 5 mm polyp was found in the transverse colon. The polyp was sessile.       The polyp was removed with a cold snare. Resection and retrieval were  complete.      Copious quantities of liquid stool was found in the entire colon,       precluding visualization. Lavage of the area was performed using 50 -       200 mL of sterile water, resulting in clearance with fair visualization.      The retroflexed view of the distal rectum and anal verge was normal and       showed no anal or rectal abnormalities. Impression:            - Perianal skin tags found on perianal exam.                        - One 5 mm polyp in the transverse colon, removed with                         a cold snare. Resected and retrieved.                        - Stool in the entire examined colon.                        - The distal rectum and anal verge are normal on                         retroflexion view. Recommendation:        - Discharge patient to home (with escort).                        - Resume previous diet today.                        - Continue present medications.                        - Await pathology results.                        - Repeat colonoscopy in 5 years for  surveillance. Procedure Code(s):     --- Professional ---                        [phone], Colonoscopy, flexible; with removal of                         tumor(s), polyp(s), or other lesion(s) by snare                         technique Diagnosis Code(s):     --- Professional ---                        Z12.11, Encounter for screening for malignant neoplasm                         of colon                        K63.5, Polyp of colon                        K64.4, Residual hemorrhoidal skin tags CPT copyright 2019  American Medical Association. All rights reserved. The codes documented in this report are preliminary and upon coder review may  be revised to meet current compliance requirements. Dr. Ulyess Mort Lin Landsman MD, MD 01/07/2021 10:13:10 AM This report has been signed electronically. Number of Addenda: 0 Note Initiated On: 01/07/2021 9:33 AM Scope Withdrawal Time: 0 hours 17 minutes 8 seconds  Total Procedure Duration: 0 hours 20 minutes 40 seconds  Estimated Blood Loss:  Estimated blood loss: none.      [facility]

## 2021-01-07 NOTE — Transfer of Care (Signed)
Immediate Anesthesia Transfer of Care Note  Patient: [name]  Procedure(s) Performed: COLONOSCOPY WITH PROPOFOL  Patient Location: Endoscopy Unit  Anesthesia Type:General  Level of Consciousness: drowsy  Airway & Oxygen Therapy: Patient Spontanous Breathing  Post-op Assessment: Report given to RN and Post -op Vital signs reviewed and stable  Post vital signs: Reviewed  Last Vitals:  Vitals Value Taken Time  BP    Temp    Pulse 64 01/07/21 1013  Resp 14 01/07/21 1013  SpO2 98 % 01/07/21 1013  Vitals shown include unvalidated device data.  Last Pain:  Vitals:   01/07/21 0835  TempSrc: Temporal  PainSc: 0-No pain         Complications: No notable events documented.

## 2021-01-07 NOTE — H&P (Signed)
Riley Darby, MD [address]  [place]  [place], [place] [address]  Main: [phone]  Fax: [phone] Pager: [phone]  Primary Care Physician:  Lesleigh Noe, MD Primary Gastroenterologist:  Dr. Cephas Peterson  Pre-Procedure History & Physical: HPI:  Riley Peterson is a 65 y.o. male is here for an colonoscopy.   Past Medical History:  Diagnosis Date   Elevated PSA    Hyperlipidemia    Obesity    compulsive overeater    Past Surgical History:  Procedure Laterality Date   CHOLECYSTECTOMY     KIDNEY STONE SURGERY     TONSILLECTOMY     VASECTOMY      Prior to Admission medications   Medication Sig Start Date End Date Taking? Authorizing Provider  atorvastatin (LIPITOR) 10 MG tablet Take 1 tablet (10 mg total) by mouth daily. 10/21/20  Yes Lesleigh Noe, MD  finasteride (PROSCAR) 5 MG tablet Take 1 tablet (5 mg total) by mouth daily. 12/03/20  Yes Hollice Espy, MD  tamsulosin (FLOMAX) 0.4 MG CAPS capsule Take 2 capsules (0.8 mg total) by mouth daily. 12/02/20  Yes Lesleigh Noe, MD    Allergies as of 10/28/2020 - Review Complete 09/17/2019  Allergen Reaction Noted   Penicillins  08/11/2010    Family History  Problem Relation Age of Onset   Stroke Mother 13   Transient ischemic attack Mother    Diabetes Mother    Stroke Father 2   Hypertension Father    Pancreatic cancer Maternal Uncle 61   Rheum arthritis Paternal Aunt    Epilepsy Paternal Uncle    Throat cancer Paternal Uncle    Stroke Maternal Grandmother    Stroke Paternal Grandmother     Social History   Socioeconomic History   Marital status: Married    Spouse name: Riley Peterson   Number of children: 2   Years of education: Airline pilot   Highest education level: Not on file  Occupational History   Occupation: Mining engineer: [employer]  Tobacco Use   Smoking status: Never   Smokeless tobacco: Never  Substance and Sexual Activity   Alcohol use: Yes    Comment:  rare, 1 glass of wine occasionally   Drug use: No   Sexual activity: Not Currently    Birth control/protection: Surgical  Other Topics Concern   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   Living: with wife Riley Peterson   Work: Theme park manager at [employer]      Family: adult children - Ria Comment and Thurmond Butts - 2 grandchildren (in [place])       Enjoys: bicycling and spending time with grandchildren      Exercise: has home equipment - doing some exercise on and off   Diet: when on the program - veggies/proteins - no flour/sugar      Safety   Seat belts: Yes    Guns: No   Safe in relationships: Yes    Social Determinants of Radio broadcast assistant Strain: Not on file  Food Insecurity: Not on file  Transportation Needs: Not on file  Physical Activity: Not on file  Stress: Not on file  Social Connections: Not on file  Intimate Partner Violence: Not on file    Review of Systems: See HPI, otherwise negative ROS  Physical Exam: BP 134/84   Pulse 77   Temp (!) 97.1  F (36.2 C) (Temporal)   Resp 20   Ht 6\' 1"  (1.854 m)   Wt 122.5 kg   SpO2 98%   BMI 35.62 kg/m  General:   Alert,  pleasant and cooperative in NAD Head:  Normocephalic and atraumatic. Neck:  Supple; no masses or thyromegaly. Lungs:  Clear throughout to auscultation.    Heart:  Regular rate and rhythm. Abdomen:  Soft, nontender and nondistended. Normal bowel sounds, without guarding, and without rebound.   Neurologic:  Alert and  oriented x4;  grossly normal neurologically.  Impression/Plan: Riley Peterson is here for an colonoscopy to be performed for colon cancer screening  Risks, benefits, limitations, and alternatives regarding  colonoscopy have been reviewed with the patient.  Questions have been answered.  All parties agreeable.   Sherri Sear, MD  01/07/2021, 8:36 AM

## 2021-01-07 NOTE — Anesthesia Preprocedure Evaluation (Signed)
Anesthesia Evaluation  Patient identified by MRN, date of birth, ID band Patient awake    Reviewed: Allergy & Precautions, NPO status , Patient's Chart, lab work & pertinent test results  History of Anesthesia Complications Negative for: history of anesthetic complications  Airway Mallampati: II  TM Distance: >3 FB Neck ROM: Full    Dental no notable dental hx.    Pulmonary neg pulmonary ROS, neg sleep apnea, neg COPD,    breath sounds clear to auscultation- rhonchi (-) wheezing      Cardiovascular Exercise Tolerance: Good (-) hypertension(-) CAD and (-) Past MI  Rhythm:Regular Rate:Normal - Systolic murmurs and - Diastolic murmurs    Neuro/Psych negative neurological ROS  negative psych ROS   GI/Hepatic negative GI ROS, Neg liver ROS,   Endo/Other  negative endocrine ROSneg diabetes  Renal/GU negative Renal ROS     Musculoskeletal negative musculoskeletal ROS (+)   Abdominal (+) + obese,   Peds  Hematology negative hematology ROS (+)   Anesthesia Other Findings Past Medical History: No date: Elevated PSA No date: Hyperlipidemia No date: Obesity     Comment:  compulsive overeater   Reproductive/Obstetrics                             Anesthesia Physical Anesthesia Plan  ASA: 2  Anesthesia Plan: General   Post-op Pain Management:    Induction: Intravenous  PONV Risk Score and Plan: 1 and Propofol infusion  Airway Management Planned: Natural Airway  Additional Equipment:   Intra-op Plan:   Post-operative Plan:   Informed Consent: I have reviewed the patients History and Physical, chart, labs and discussed the procedure including the risks, benefits and alternatives for the proposed anesthesia with the patient or authorized representative who has indicated his/her understanding and acceptance.     Dental advisory given  Plan Discussed with: CRNA and  Anesthesiologist  Anesthesia Plan Comments:         Anesthesia Quick Evaluation

## 2021-01-08 ENCOUNTER — Encounter: Payer: Self-pay | Admitting: Gastroenterology

## 2021-01-08 LAB — SURGICAL PATHOLOGY

## 2021-01-09 ENCOUNTER — Encounter: Payer: Self-pay | Admitting: Gastroenterology

## 2021-01-20 ENCOUNTER — Ambulatory Visit: Payer: BC Managed Care – PPO

## 2021-01-20 ENCOUNTER — Other Ambulatory Visit: Payer: Self-pay

## 2021-01-20 ENCOUNTER — Ambulatory Visit (INDEPENDENT_AMBULATORY_CARE_PROVIDER_SITE_OTHER): Payer: BC Managed Care – PPO

## 2021-01-20 DIAGNOSIS — Z23 Encounter for immunization: Secondary | ICD-10-CM

## 2021-01-20 NOTE — Progress Notes (Signed)
Per orders of Allie Bossier, in Dr. Verda Cumins absence, 2nd injection of shingrix given by Loreen Freud. Patient tolerated injection well.

## 2021-01-30 ENCOUNTER — Ambulatory Visit: Payer: BC Managed Care – PPO | Admitting: Urology

## 2021-01-30 ENCOUNTER — Other Ambulatory Visit
Admission: RE | Admit: 2021-01-30 | Discharge: 2021-01-30 | Disposition: A | Discharge status: Home / Self Care | Payer: BC Managed Care – PPO | Attending: Urology | Admitting: Urology

## 2021-01-30 ENCOUNTER — Other Ambulatory Visit: Payer: Self-pay

## 2021-01-30 VITALS — BP 139/90 | HR 105 | Ht 73.0 in | Wt 270.0 lb

## 2021-01-30 DIAGNOSIS — N401 Enlarged prostate with lower urinary tract symptoms: Secondary | ICD-10-CM

## 2021-01-30 DIAGNOSIS — R350 Frequency of micturition: Secondary | ICD-10-CM

## 2021-01-30 DIAGNOSIS — R972 Elevated prostate specific antigen [PSA]: Secondary | ICD-10-CM

## 2021-01-30 DIAGNOSIS — R339 Retention of urine, unspecified: Secondary | ICD-10-CM

## 2021-01-30 LAB — URINALYSIS, COMPLETE (UACMP) WITH MICROSCOPIC
Bilirubin Urine: NEGATIVE
Glucose, UA: NEGATIVE mg/dL
Hgb urine dipstick: NEGATIVE
Ketones, ur: NEGATIVE mg/dL
Leukocytes,Ua: NEGATIVE
Nitrite: NEGATIVE
Protein, ur: NEGATIVE mg/dL
Specific Gravity, Urine: 1.02 (ref 1.005–1.030)
pH: 5.5 (ref 5.0–8.0)

## 2021-01-30 LAB — BLADDER SCAN AMB NON-IMAGING: Scan Result: 193

## 2021-01-30 NOTE — H&P (View-Only) (Signed)
01/30/2021 10:10 AM   Riley Peterson [birth year] [id number]  Referring provider: Lesleigh Noe, MD [place],  [place] [address]  Chief Complaint  Patient presents with   Post-op Follow-up    HOLEP 6wk w/IPSS PVR    HPI: 65 year old male with BPH and poorly controlled urinary symptoms returns today to discuss HoLEP.  He is scheduled to undergo this later this month.  He has some additional questions today.  Notably, he was initially seen and evaluated for elevated PSA which has slowly risen.  He underwent prostate biopsy which was negative as well as significantly enlarged prostate, 214 cc by TRUS with a median lobe.  Symptoms poorly controlled on Flomax alone.  He has not started finasteride about 2 months ago and has seen some modest improvement but still relatively symptomatic.   IPSS     Row Name 01/30/21 1000         International Prostate Symptom Score   How often have you had the sensation of not emptying your bladder? Almost always     How often have you had to urinate less than every two hours? Almost always     How often have you found you stopped and started again several times when you urinated? Not at All     How often have you found it difficult to postpone urination? More than half the time     How often have you had a weak urinary stream? Not at All     How often have you had to strain to start urination? Not at All     How many times did you typically get up at night to urinate? None     Total IPSS Score 14           Quality of Life due to urinary symptoms     If you were to spend the rest of your life with your urinary condition just the way it is now how would you feel about that? Mixed             Score:  1-7 Mild 8-19 Moderate 20-35 Severe    PMH: Past Medical History:  Diagnosis Date   Elevated PSA    Hyperlipidemia    Obesity    compulsive overeater    Surgical History: Past Surgical History:  Procedure Laterality  Date   CHOLECYSTECTOMY     COLONOSCOPY WITH PROPOFOL N/A 01/07/2021   Procedure: COLONOSCOPY WITH PROPOFOL;  Surgeon: Lin Landsman, MD;  Location: [facility] ENDOSCOPY;  Service: Gastroenterology;  Laterality: N/A;   KIDNEY STONE SURGERY     TONSILLECTOMY     VASECTOMY      Home Medications:  Allergies as of 01/30/2021       Reactions   Penicillins         Medication List        Accurate as of January 30, 2021 10:10 AM. If you have any questions, ask your nurse or doctor.          atorvastatin 10 MG tablet Commonly known as: LIPITOR Take 1 tablet (10 mg total) by mouth daily.   finasteride 5 MG tablet Commonly known as: PROSCAR Take 1 tablet (5 mg total) by mouth daily.   tamsulosin 0.4 MG Caps capsule Commonly known as: FLOMAX Take 2 capsules (0.8 mg total) by mouth daily.        Allergies:  Allergies  Allergen Reactions   Penicillins     Family History:  Family History  Problem Relation Age of Onset   Stroke Mother 56   Transient ischemic attack Mother    Diabetes Mother    Stroke Father 12   Hypertension Father    Pancreatic cancer Maternal Uncle 61   Rheum arthritis Paternal Aunt    Epilepsy Paternal Uncle    Throat cancer Paternal Uncle    Stroke Maternal Grandmother    Stroke Paternal Grandmother     Social History:  reports that he has never smoked. He has never used smokeless tobacco. He reports current alcohol use. He reports that he does not use drugs.   Physical Exam: BP 139/90   Pulse (!) 105   Ht 6\' 1"  (1.854 m)   Wt 270 lb (122.5 kg)   BMI 35.62 kg/m   Constitutional:  Alert and oriented, No acute distress. HEENT: [place] AT, moist mucus membranes.  Trachea midline, no masses. Cardiovascular: No clubbing, cyanosis, or edema. Respiratory: Normal respiratory effort, no increased work of breathing. Skin: No rashes, bruises or suspicious lesions. Neurologic: Grossly intact, no focal deficits, moving all 4 extremities. Psychiatric: Normal  mood and affect.  Component     Latest Ref Rng & Units 01/30/2021  Color, Urine     YELLOW YELLOW  Appearance     CLEAR CLEAR  Specific Gravity, Urine     1.005 - 1.030 1.020  pH     5.0 - 8.0 5.5  Glucose, UA     NEGATIVE mg/dL NEGATIVE  Hgb urine dipstick     NEGATIVE NEGATIVE  Bilirubin Urine     NEGATIVE NEGATIVE  Ketones, ur     NEGATIVE mg/dL NEGATIVE  Protein     NEGATIVE mg/dL NEGATIVE  Nitrite     NEGATIVE NEGATIVE  Leukocytes,Ua     NEGATIVE NEGATIVE  Squamous Epithelial / LPF     0 - 5 0-5  WBC, UA     0 - 5 WBC/hpf 0-5  RBC / HPF     0 - 5 RBC/hpf 0-5  Bacteria, UA     NONE SEEN FEW (A)    Results for orders placed or performed in visit on 01/30/21  BLADDER SCAN AMB NON-IMAGING  Result Value Ref Range   Scan Result 193 ml      Assessment & Plan:     1. Benign prostatic hyperplasia with urinary frequency Scheduled for HoLEP, all additional questions answered  We discussed the common postoperative course following holep including need for overnight Foley catheter, temporary worsening of irritative voiding symptoms, and occasional stress incontinence which typically lasts up to 6 months but can persist.  We discussed retrograde ejaculation and damage to surrounding structures including the urinary sphincter. Other uncommon complications including hematuria and urinary tract infection.  He understands all of the above and is willing to proceed as planned.  - BLADDER SCAN AMB NON-IMAGING - Urinalysis, Complete w Microscopic (For [facility] ONLY); Future - Urine culture; Future  2. Incomplete bladder emptying Persistently elevated PVR on maximal medical therapy, Flomax and finasteride  3. Elevated PSA Status post negative biopsy, PSA is appropriate when adjusted for volume  We will obtain a new PSA baseline 6 months postop    Hollice Espy, MD  [facility] [address], [place], [place] [address] [phone]

## 2021-01-30 NOTE — Progress Notes (Signed)
01/30/2021 10:10 AM   Riley Peterson 12-31-1955 [id number]  Referring provider: Lesleigh Noe, MD [place],  [place] [address]  Chief Complaint  Patient presents with   Post-op Follow-up    HOLEP 6wk w/IPSS PVR    HPI: 65 year old male with BPH and poorly controlled urinary symptoms returns today to discuss HoLEP.  He is scheduled to undergo this later this month.  He has some additional questions today.  Notably, he was initially seen and evaluated for elevated PSA which has slowly risen.  He underwent prostate biopsy which was negative as well as significantly enlarged prostate, 214 cc by TRUS with a median lobe.  Symptoms poorly controlled on Flomax alone.  He has not started finasteride about 2 months ago and has seen some modest improvement but still relatively symptomatic.   IPSS     Row Name 01/30/21 1000         International Prostate Symptom Score   How often have you had the sensation of not emptying your bladder? Almost always     How often have you had to urinate less than every two hours? Almost always     How often have you found you stopped and started again several times when you urinated? Not at All     How often have you found it difficult to postpone urination? More than half the time     How often have you had a weak urinary stream? Not at All     How often have you had to strain to start urination? Not at All     How many times did you typically get up at night to urinate? None     Total IPSS Score 14           Quality of Life due to urinary symptoms     If you were to spend the rest of your life with your urinary condition just the way it is now how would you feel about that? Mixed             Score:  1-7 Mild 8-19 Moderate 20-35 Severe    PMH: Past Medical History:  Diagnosis Date   Elevated PSA    Hyperlipidemia    Obesity    compulsive overeater    Surgical History: Past Surgical History:  Procedure Laterality  Date   CHOLECYSTECTOMY     COLONOSCOPY WITH PROPOFOL N/A 01/07/2021   Procedure: COLONOSCOPY WITH PROPOFOL;  Surgeon: Lin Landsman, MD;  Location: [facility] ENDOSCOPY;  Service: Gastroenterology;  Laterality: N/A;   KIDNEY STONE SURGERY     TONSILLECTOMY     VASECTOMY      Home Medications:  Allergies as of 01/30/2021       Reactions   Penicillins         Medication List        Accurate as of January 30, 2021 10:10 AM. If you have any questions, ask your nurse or doctor.          atorvastatin 10 MG tablet Commonly known as: LIPITOR Take 1 tablet (10 mg total) by mouth daily.   finasteride 5 MG tablet Commonly known as: PROSCAR Take 1 tablet (5 mg total) by mouth daily.   tamsulosin 0.4 MG Caps capsule Commonly known as: FLOMAX Take 2 capsules (0.8 mg total) by mouth daily.        Allergies:  Allergies  Allergen Reactions   Penicillins     Family History:  Family History  Problem Relation Age of Onset   Stroke Mother 95   Transient ischemic attack Mother    Diabetes Mother    Stroke Father 44   Hypertension Father    Pancreatic cancer Maternal Uncle 41   Rheum arthritis Paternal Aunt    Epilepsy Paternal Uncle    Throat cancer Paternal Uncle    Stroke Maternal Grandmother    Stroke Paternal Grandmother     Social History:  reports that he has never smoked. He has never used smokeless tobacco. He reports current alcohol use. He reports that he does not use drugs.   Physical Exam: BP 139/90   Pulse (!) 105   Ht 6\' 1"  (1.854 m)   Wt 270 lb (122.5 kg)   BMI 35.62 kg/m   Constitutional:  Alert and oriented, No acute distress. HEENT: [place] AT, moist mucus membranes.  Trachea midline, no masses. Cardiovascular: No clubbing, cyanosis, or edema. Respiratory: Normal respiratory effort, no increased work of breathing. Skin: No rashes, bruises or suspicious lesions. Neurologic: Grossly intact, no focal deficits, moving all 4 extremities. Psychiatric: Normal  mood and affect.  Component     Latest Ref Rng & Units 01/30/2021  Color, Urine     YELLOW YELLOW  Appearance     CLEAR CLEAR  Specific Gravity, Urine     1.005 - 1.030 1.020  pH     5.0 - 8.0 5.5  Glucose, UA     NEGATIVE mg/dL NEGATIVE  Hgb urine dipstick     NEGATIVE NEGATIVE  Bilirubin Urine     NEGATIVE NEGATIVE  Ketones, ur     NEGATIVE mg/dL NEGATIVE  Protein     NEGATIVE mg/dL NEGATIVE  Nitrite     NEGATIVE NEGATIVE  Leukocytes,Ua     NEGATIVE NEGATIVE  Squamous Epithelial / LPF     0 - 5 0-5  WBC, UA     0 - 5 WBC/hpf 0-5  RBC / HPF     0 - 5 RBC/hpf 0-5  Bacteria, UA     NONE SEEN FEW (A)    Results for orders placed or performed in visit on 01/30/21  BLADDER SCAN AMB NON-IMAGING  Result Value Ref Range   Scan Result 193 ml      Assessment & Plan:     1. Benign prostatic hyperplasia with urinary frequency Scheduled for HoLEP, all additional questions answered  We discussed the common postoperative course following holep including need for overnight Foley catheter, temporary worsening of irritative voiding symptoms, and occasional stress incontinence which typically lasts up to 6 months but can persist.  We discussed retrograde ejaculation and damage to surrounding structures including the urinary sphincter. Other uncommon complications including hematuria and urinary tract infection.  He understands all of the above and is willing to proceed as planned.  - BLADDER SCAN AMB NON-IMAGING - Urinalysis, Complete w Microscopic (For [facility] ONLY); Future - Urine culture; Future  2. Incomplete bladder emptying Persistently elevated PVR on maximal medical therapy, Flomax and finasteride  3. Elevated PSA Status post negative biopsy, PSA is appropriate when adjusted for volume  We will obtain a new PSA baseline 6 months postop    Hollice Espy, MD  [facility] [address] [place], [place] [address] [phone]

## 2021-01-31 LAB — URINE CULTURE: Culture: NO GROWTH

## 2021-02-03 ENCOUNTER — Ambulatory Visit: Payer: BC Managed Care – PPO | Admitting: Urology

## 2021-02-13 ENCOUNTER — Encounter: Payer: Self-pay | Admitting: Urology

## 2021-02-15 MED ORDER — CHLORHEXIDINE GLUCONATE 0.12 % MT SOLN: 15.0000 mL | Freq: Once | OROMUCOSAL | Status: DC

## 2021-02-15 MED ORDER — ORAL CARE MOUTH RINSE: 15.0000 mL | Freq: Once | OROMUCOSAL | Status: DC

## 2021-02-15 MED ORDER — LACTATED RINGERS IV SOLN: INTRAVENOUS | Status: DC

## 2021-02-15 MED ORDER — CEFAZOLIN SODIUM-DEXTROSE 2-4 GM/100ML-% IV SOLN
2.0000 g | INTRAVENOUS | Status: AC
  Administered 2021-02-16: 3 g via INTRAVENOUS

## 2021-02-16 ENCOUNTER — Ambulatory Visit: Payer: BC Managed Care – PPO | Admitting: Anesthesiology

## 2021-02-16 ENCOUNTER — Encounter
Admission: RE | Disposition: A | Discharge status: Home / Self Care | Payer: Self-pay | Source: Ambulatory Visit | Attending: Urology

## 2021-02-16 ENCOUNTER — Encounter: Payer: Self-pay | Admitting: Urology

## 2021-02-16 ENCOUNTER — Ambulatory Visit
Admission: RE | Admit: 2021-02-16 | Discharge: 2021-02-16 | Disposition: A | Discharge status: Home / Self Care | Payer: BC Managed Care – PPO | Source: Ambulatory Visit | Attending: Urology | Admitting: Urology

## 2021-02-16 ENCOUNTER — Other Ambulatory Visit: Payer: Self-pay

## 2021-02-16 DIAGNOSIS — Z880 Allergy status to penicillin: Secondary | ICD-10-CM | POA: Insufficient documentation

## 2021-02-16 DIAGNOSIS — N401 Enlarged prostate with lower urinary tract symptoms: Secondary | ICD-10-CM

## 2021-02-16 DIAGNOSIS — Z6835 Body mass index (BMI) 35.0-35.9, adult: Secondary | ICD-10-CM | POA: Insufficient documentation

## 2021-02-16 DIAGNOSIS — N320 Bladder-neck obstruction: Secondary | ICD-10-CM | POA: Insufficient documentation

## 2021-02-16 DIAGNOSIS — Z8249 Family history of ischemic heart disease and other diseases of the circulatory system: Secondary | ICD-10-CM | POA: Insufficient documentation

## 2021-02-16 DIAGNOSIS — Z8261 Family history of arthritis: Secondary | ICD-10-CM | POA: Insufficient documentation

## 2021-02-16 DIAGNOSIS — Z833 Family history of diabetes mellitus: Secondary | ICD-10-CM | POA: Diagnosis not present

## 2021-02-16 DIAGNOSIS — Z800 Family history of malignant neoplasm of digestive organs: Secondary | ICD-10-CM | POA: Diagnosis not present

## 2021-02-16 DIAGNOSIS — E669 Obesity, unspecified: Secondary | ICD-10-CM | POA: Insufficient documentation

## 2021-02-16 DIAGNOSIS — N138 Other obstructive and reflux uropathy: Secondary | ICD-10-CM

## 2021-02-16 HISTORY — DX: Benign prostatic hyperplasia without lower urinary tract symptoms: N40.0

## 2021-02-16 HISTORY — DX: Benign prostatic hyperplasia with lower urinary tract symptoms: N40.1

## 2021-02-16 HISTORY — PX: HOLEP-LASER ENUCLEATION OF THE PROSTATE WITH MORCELLATION: SHX6641

## 2021-02-16 SURGERY — ENUCLEATION, PROSTATE, USING LASER, WITH MORCELLATION
Anesthesia: General | Site: Prostate

## 2021-02-16 MED ORDER — DEXAMETHASONE SODIUM PHOSPHATE 10 MG/ML IJ SOLN
INTRAMUSCULAR | Status: DC | PRN
Start: 2021-02-16 — End: 2021-02-16
  Administered 2021-02-16: 10 mg via INTRAVENOUS

## 2021-02-16 MED ORDER — HYDROCODONE-ACETAMINOPHEN 5-325 MG PO TABS: 1.0000 | ORAL_TABLET | Freq: Four times a day (QID) | ORAL | 0 refills | Status: DC | PRN

## 2021-02-16 MED ORDER — GLYCOPYRROLATE 0.2 MG/ML IJ SOLN
INTRAMUSCULAR | Status: AC
  Filled 2021-02-16: qty 3

## 2021-02-16 MED ORDER — OXYCODONE HCL 5 MG PO TABS: 5.0000 mg | ORAL_TABLET | Freq: Once | ORAL | Status: DC | PRN

## 2021-02-16 MED ORDER — FENTANYL CITRATE (PF) 100 MCG/2ML IJ SOLN
INTRAMUSCULAR | Status: AC
  Filled 2021-02-16: qty 2

## 2021-02-16 MED ORDER — PHENYLEPHRINE HCL (PRESSORS) 10 MG/ML IV SOLN
INTRAVENOUS | Status: DC | PRN
  Administered 2021-02-16 (×3): 200 ug via INTRAVENOUS
  Administered 2021-02-16: 100 ug via INTRAVENOUS

## 2021-02-16 MED ORDER — DEXMEDETOMIDINE (PRECEDEX) IN NS 20 MCG/5ML (4 MCG/ML) IV SYRINGE
PREFILLED_SYRINGE | INTRAVENOUS | Status: AC
  Filled 2021-02-16: qty 10

## 2021-02-16 MED ORDER — FUROSEMIDE 10 MG/ML IJ SOLN
INTRAMUSCULAR | Status: AC
  Filled 2021-02-16: qty 4

## 2021-02-16 MED ORDER — LIDOCAINE HCL (PF) 2 % IJ SOLN
INTRAMUSCULAR | Status: AC
  Filled 2021-02-16: qty 15

## 2021-02-16 MED ORDER — ROCURONIUM BROMIDE 10 MG/ML (PF) SYRINGE
PREFILLED_SYRINGE | INTRAVENOUS | Status: AC
  Filled 2021-02-16: qty 20

## 2021-02-16 MED ORDER — DEXMEDETOMIDINE (PRECEDEX) IN NS 20 MCG/5ML (4 MCG/ML) IV SYRINGE
PREFILLED_SYRINGE | INTRAVENOUS | Status: AC
  Filled 2021-02-16: qty 5

## 2021-02-16 MED ORDER — ONDANSETRON HCL 4 MG/2ML IJ SOLN
INTRAMUSCULAR | Status: AC
  Filled 2021-02-16: qty 10

## 2021-02-16 MED ORDER — ACETAMINOPHEN 10 MG/ML IV SOLN
INTRAVENOUS | Status: AC
  Filled 2021-02-16: qty 100

## 2021-02-16 MED ORDER — FENTANYL CITRATE (PF) 100 MCG/2ML IJ SOLN
25.0000 ug | INTRAMUSCULAR | Status: AC | PRN
  Administered 2021-02-16: 25 ug via INTRAVENOUS
  Administered 2021-02-16: 50 ug via INTRAVENOUS
  Administered 2021-02-16 (×2): 25 ug via INTRAVENOUS

## 2021-02-16 MED ORDER — CHLORHEXIDINE GLUCONATE 0.12 % MT SOLN
OROMUCOSAL | Status: AC
  Filled 2021-02-16: qty 15

## 2021-02-16 MED ORDER — OXYCODONE HCL 5 MG/5ML PO SOLN: 5.0000 mg | Freq: Once | ORAL | Status: DC | PRN

## 2021-02-16 MED ORDER — SUGAMMADEX SODIUM 500 MG/5ML IV SOLN
INTRAVENOUS | Status: DC | PRN
  Administered 2021-02-16 (×3): 100 mg via INTRAVENOUS
  Administered 2021-02-16: 200 mg via INTRAVENOUS

## 2021-02-16 MED ORDER — OXYBUTYNIN CHLORIDE 5 MG PO TABS: 5.0000 mg | ORAL_TABLET | Freq: Three times a day (TID) | ORAL | 0 refills | Status: DC | PRN

## 2021-02-16 MED ORDER — KETOROLAC TROMETHAMINE 30 MG/ML IJ SOLN
INTRAMUSCULAR | Status: AC
  Filled 2021-02-16: qty 1

## 2021-02-16 MED ORDER — ONDANSETRON HCL 4 MG/2ML IJ SOLN
INTRAMUSCULAR | Status: DC | PRN
  Administered 2021-02-16: 4 mg via INTRAVENOUS

## 2021-02-16 MED ORDER — CEFAZOLIN SODIUM 1 G IJ SOLR
INTRAMUSCULAR | Status: AC
  Filled 2021-02-16: qty 10

## 2021-02-16 MED ORDER — GLYCOPYRROLATE 0.2 MG/ML IJ SOLN
INTRAMUSCULAR | Status: DC | PRN
  Administered 2021-02-16: .2 mg via INTRAVENOUS

## 2021-02-16 MED ORDER — DEXAMETHASONE SODIUM PHOSPHATE 10 MG/ML IJ SOLN
INTRAMUSCULAR | Status: AC
  Filled 2021-02-16: qty 2

## 2021-02-16 MED ORDER — LACTATED RINGERS IV SOLN: INTRAVENOUS | Status: DC | PRN

## 2021-02-16 MED ORDER — 0.9 % SODIUM CHLORIDE (POUR BTL) OPTIME
TOPICAL | Status: DC | PRN
  Administered 2021-02-16: 500 mL

## 2021-02-16 MED ORDER — SODIUM CHLORIDE 0.9 % IR SOLN
Status: DC | PRN
  Administered 2021-02-16 (×10): 3000 mL
  Administered 2021-02-16: 3600 mL
  Administered 2021-02-16 (×2): 3000 mL

## 2021-02-16 MED ORDER — DEXMEDETOMIDINE (PRECEDEX) IN NS 20 MCG/5ML (4 MCG/ML) IV SYRINGE
PREFILLED_SYRINGE | INTRAVENOUS | Status: DC | PRN
  Administered 2021-02-16 (×2): 8 ug via INTRAVENOUS

## 2021-02-16 MED ORDER — LIDOCAINE HCL (CARDIAC) PF 100 MG/5ML IV SOSY
PREFILLED_SYRINGE | INTRAVENOUS | Status: DC | PRN
  Administered 2021-02-16: 100 mg via INTRAVENOUS

## 2021-02-16 MED ORDER — PROPOFOL 10 MG/ML IV BOLUS
INTRAVENOUS | Status: DC | PRN
  Administered 2021-02-16: 150 mg via INTRAVENOUS

## 2021-02-16 MED ORDER — ACETAMINOPHEN 10 MG/ML IV SOLN
INTRAVENOUS | Status: DC | PRN
  Administered 2021-02-16: 1000 mg via INTRAVENOUS

## 2021-02-16 MED ORDER — NEOSTIGMINE METHYLSULFATE 10 MG/10ML IV SOLN
INTRAVENOUS | Status: AC
  Filled 2021-02-16: qty 1

## 2021-02-16 MED ORDER — FENTANYL CITRATE (PF) 100 MCG/2ML IJ SOLN
INTRAMUSCULAR | Status: AC
  Administered 2021-02-16: 25 ug via INTRAVENOUS
  Filled 2021-02-16: qty 2

## 2021-02-16 MED ORDER — FENTANYL CITRATE (PF) 100 MCG/2ML IJ SOLN
INTRAMUSCULAR | Status: DC | PRN
  Administered 2021-02-16: 25 ug via INTRAVENOUS
  Administered 2021-02-16 (×2): 50 ug via INTRAVENOUS
  Administered 2021-02-16 (×3): 25 ug via INTRAVENOUS

## 2021-02-16 MED ORDER — CEFAZOLIN SODIUM-DEXTROSE 2-4 GM/100ML-% IV SOLN
INTRAVENOUS | Status: AC
  Filled 2021-02-16: qty 100

## 2021-02-16 MED ORDER — ROCURONIUM 10MG/ML (10ML) SYRINGE FOR MEDFUSION PUMP - OPTIME
INTRAVENOUS | Status: DC | PRN
  Administered 2021-02-16 (×2): 10 mg via INTRAVENOUS
  Administered 2021-02-16: 70 mg via INTRAVENOUS
  Administered 2021-02-16 (×2): 10 mg via INTRAVENOUS

## 2021-02-16 MED ORDER — FUROSEMIDE 10 MG/ML IJ SOLN
INTRAMUSCULAR | Status: DC | PRN
  Administered 2021-02-16: 10 mg via INTRAMUSCULAR

## 2021-02-16 SURGICAL SUPPLY — 35 items
ADAPTER IRRIG TUBE 2 SPIKE SOL (ADAPTER) ×4 IMPLANT
ADPR TBG 2 SPK PMP STRL ASCP (ADAPTER) ×2
BAG DRN LRG CPC RND TRDRP CNTR (MISCELLANEOUS)
BAG DRN RND TRDRP ANRFLXCHMBR (UROLOGICAL SUPPLIES)
BAG URINE DRAIN 2000ML AR STRL (UROLOGICAL SUPPLIES) IMPLANT
BAG URO DRAIN 4000ML (MISCELLANEOUS) IMPLANT
CATH FOL 2WAY LX 20X30 (CATHETERS) ×2 IMPLANT
CATH FOL 2WAY LX 22X30 (CATHETERS) IMPLANT
CATH FOLEY 3WAY 30CC 22FR (CATHETERS) IMPLANT
CATH URETL 5X70 OPEN END (CATHETERS) ×2 IMPLANT
CONTAINER COLLECT MORCELLATR (MISCELLANEOUS) ×1 IMPLANT
DRAPE 3/4 80X56 (DRAPES) ×2 IMPLANT
DRAPE UTILITY 15X26 TOWEL STRL (DRAPES) IMPLANT
FIBER LASER FLEXIVA PULSE 550 (Laser) ×2 IMPLANT
FILTER OVERFLOW MORCELLATOR (FILTER) ×1 IMPLANT
GAUZE 4X4 16PLY ~~LOC~~+RFID DBL (SPONGE) IMPLANT
GLOVE SURG ENC MOIS LTX SZ6.5 (GLOVE) ×4 IMPLANT
GOWN STRL REUS W/ TWL LRG LVL3 (GOWN DISPOSABLE) ×2 IMPLANT
GOWN STRL REUS W/TWL LRG LVL3 (GOWN DISPOSABLE) ×4
HOLDER FOLEY CATH W/STRAP (MISCELLANEOUS) ×2 IMPLANT
IV NS IRRIG 3000ML ARTHROMATIC (IV SOLUTION) ×32 IMPLANT
KIT TURNOVER CYSTO (KITS) ×2 IMPLANT
MBRN O SEALING YLW 17 FOR INST (MISCELLANEOUS) ×2
MEMBRANE SLNG YLW 17 FOR INST (MISCELLANEOUS) ×1 IMPLANT
MORCELLATOR COLLECT CONTAINER (MISCELLANEOUS) ×2
MORCELLATOR OVERFLOW FILTER (FILTER) ×2
MORCELLATOR ROTATION 4.75 335 (MISCELLANEOUS) ×2 IMPLANT
PACK CYSTO AR (MISCELLANEOUS) ×2 IMPLANT
SET CYSTO W/LG BORE CLAMP LF (SET/KITS/TRAYS/PACK) IMPLANT
SET IRRIG Y TYPE TUR BLADDER L (SET/KITS/TRAYS/PACK) ×2 IMPLANT
SLEEVE PROTECTION STRL DISP (MISCELLANEOUS) ×4 IMPLANT
SYR TOOMEY IRRIG 70ML (MISCELLANEOUS) ×2
SYRINGE TOOMEY IRRIG 70ML (MISCELLANEOUS) ×1 IMPLANT
TUBE PUMP MORCELLATOR PIRANHA (TUBING) ×2 IMPLANT
WATER STERILE IRR 1000ML POUR (IV SOLUTION) ×2 IMPLANT

## 2021-02-16 NOTE — Anesthesia Procedure Notes (Signed)
Procedure Name: Intubation Date/Time: 02/16/2021 1:19 PM Performed by: Gayland Curry, CRNA Pre-anesthesia Checklist: Patient identified, Emergency Drugs available, Suction available and Patient being monitored Patient Re-evaluated:Patient Re-evaluated prior to induction Oxygen Delivery Method: Circle system utilized Preoxygenation: Pre-oxygenation with 100% oxygen Induction Type: IV induction Laryngoscope Size: Mac and 4 Grade View: Grade II Tube type: Oral Tube size: 7.0 mm Number of attempts: 1 Placement Confirmation: ETT inserted through vocal cords under direct vision, positive ETCO2 and breath sounds checked- equal and bilateral Secured at: 23 cm Tube secured with: Tape Dental Injury: Teeth and Oropharynx as per pre-operative assessment  Comments: Air q 4.5 placed easily. Surgeon advises relaxation and ventilation control are needed for the procedure. Patient ventilated via air q, air q removed, suctioned oropharynx, intubation as above.

## 2021-02-16 NOTE — Op Note (Signed)
Date of procedure: 02/16/21  Preoperative diagnosis:  BPH with BOO  Postoperative diagnosis:  same   Procedure: HoLEP with morcellation  Surgeon: Hollice Espy, MD  Anesthesia: General  Complications: None  Intraoperative findings: Significant trilobar coaptation, greater than 5 cm prostatic length with large median lobe.  Trabeculated bladder with left-sided Hutch diverticulum.  EBL: Minimal  Specimens: Prostate chips  Drains: 20 French two-way Foley catheter with 60 cc in the balloon  Indication: Riley Peterson is a 65 y.o. patient with massive BPH and severe urinary symptoms.  After reviewing the management options for treatment, he elected to proceed with the above surgical procedure(s). We have discussed the potential benefits and risks of the procedure, side effects of the proposed treatment, the likelihood of the patient achieving the goals of the procedure, and any potential problems that might occur during the procedure or recuperation. Informed consent has been obtained.  Description of procedure:  The patient was taken to the operating room and general anesthesia was induced.  The patient was placed in the dorsal lithotomy position, prepped and draped in the usual sterile fashion, and preoperative antibiotics were administered. A preoperative time-out was performed.     A 26 French resectoscope sheath using a blunt angled obturator was introduced without difficulty into the bladder.  The bladder was carefully inspected and noted to be moderately trabeculated.  There is an elevated bladder neck with a good-sized intravesical component.  Due to the size of the median lobe, initially I was not able to see the trigone whatsoever but then after the nucleation of the middle lobe, I was able to see the UOs which were good distance from the bladder neck.  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  The prostatic fossa had significant trilobar coaptation with greater than  5 cm prostatic length.  A 550 m laser fiber was then brought in and using settings of 0.9 J's and 53 Hz, 2 incisions were created at the 5:00 and 7:00 positions of the bladder neck on either side of the median lobe down to the level of the bladder neck/capsular fibers.  The incision was carried down caudally meeting in the midline just above the verumontanum.  The median lobe was then enucleated from a caudal to cranial direction cleaving the adenoma off the underlying capsule rolling it towards the bladder neck and ultimately cleaving the mucosa to free the median lobe into the bladder.   Next, a semilunar incision was created at the prostatic apex on the left side again freeing up the adenoma from the underlying capsule.  Care was taken to avoid any resection past the verumontanum.  This incision was carried around laterally and cranially towards the bladder neck.  Ultimately, I was able to complete the anterior commissure mucosa and the adenoma into the bladder creating a widely patent prostatic fossa.     Next, the same similar incision was created at the right prostatic apex.  The same aforementioned technique was used to excise this lobe once this was completed and cleared from the bladder neck, the prostatic fossa was noted to be widely patent.  Hemostasis was achieved using hemostatic fiber settings.  Bilateral UOs were visualized and free of any injury.  Finally, the 29 French resectoscope was exchanged for nephroscope and using the Piranha handpiece morcellator, the bladder was distended in each of the prostate chips were evacuated.  The bladder was irrigated several times. This point time, there were no residual fibers appreciated in the bladder.  Hemostasis was adequate.  10 mg of IV Lasix was administered to help with postoperative diuresis.  A 20 French two-way Foley catheter was then inserted over a catheter guide with 60 cc in the balloon.  The catheter irrigated easily and well.  Patient was  then clean and dry, repositioned supine position, reversed from anesthesia, taken to PACU in stable condition.   Plan: Patient will return to the office in 2 for voiding trial.  He will follow-up with me in 6 weeks for IPSS/PVR.  Left him stop his Flomax in about a week but continue finasteride for the time being.    Hollice Espy, M.D.

## 2021-02-16 NOTE — Transfer of Care (Signed)
Immediate Anesthesia Transfer of Care Note  Patient: Riley Peterson  Procedure(s) Performed: HOLEP-LASER ENUCLEATION OF THE PROSTATE WITH MORCELLATION (Prostate)  Patient Location: PACU  Anesthesia Type:General  Level of Consciousness: drowsy and patient cooperative  Airway & Oxygen Therapy: Patient Spontanous Breathing and Patient connected to face mask oxygen  Post-op Assessment: Report given to RN  Post vital signs: Reviewed and stable  Last Vitals:  Vitals Value Taken Time  BP 116/73 02/16/21 1550  Temp    Pulse 72 02/16/21 1552  Resp 24 02/16/21 1552  SpO2 95 % 02/16/21 1552  Vitals shown include unvalidated device data.  Last Pain:  Vitals:   02/16/21 1208  TempSrc: Temporal  PainSc: 0-No pain         Complications: No notable events documented.

## 2021-02-16 NOTE — Discharge Instructions (Addendum)
You should stop taking Flomax in about a week.  Keep taking finasteride for the time being.  You are also prescribed finasteride, medication for overactive bladder.  You may take this as needed but try not to take it on the morning of Foley removal.   Holmium Laser Enucleation of the Prostate (HoLEP)  HoLEP is a treatment for men with benign prostatic hyperplasia (BPH). The laser surgery removed blockages of urine flow, and is done without any incisions on the body.     What is HoLEP?  HoLEP is a type of laser surgery used to treat obstruction (blockage) of urine flow as a result of benign prostatic hyperplasia (BPH). In men with BPH, the prostate gland is not cancerous, but has become enlarged. An enlarged prostate can result in a number of urinary tract symptoms such as weak urinary stream, difficulty in starting urination, inability to urinate, frequent urination, or getting up at night to urinate.  HoLEP was developed in the 1990's as a more effective and less expensive surgical option for BPH, compared to other surgical options such as laser vaporization(PVP/greenlight laser), transurethral resection of the prostate(TURP), and open simple prostatectomy.   What happens during a HoLEP?  HoLEP requires general anesthesia ("asleep" throughout the procedure).   An antibiotic is given to reduce the risk of infection  A surgical instrument called a resectoscope is inserted through the urethra (the tube that carries urine from the bladder). The resectoscope has a camera that allows the surgeon to view the internal structure of the prostate gland, and to see where the incisions are being made during surgery.  The laser is inserted into the resectoscope and is used to enucleate (free up) the enlarged prostate tissue from the capsule (outer shell) and then to seal up any blood vessels. The tissue that has been removed is pushed back into the bladder.  A morcellator is placed through the  resectoscope, and is used to suction out the prostate tissue that has been pushed into the bladder.  When the prostate tissue has been removed, the resectoscope is removed, and a foley catheter is placed to allow healing and drain the urine from the bladder.     What happens after a HoLEP?  More than 90% of patients go home the same day a few hours after surgery. Less than 10% will be admitted to the hospital overnight for observation to monitor the urine, or if they have other medical problems.  Fluid is flushed through the catheter for about 1 hour after surgery to clear any blood from the urine. It is normal to have some blood in the urine after surgery. The need for blood transfusion is extremely rare.  Eating and drinking are permitted after the procedure once the patient has fully awakened from anesthesia.  The catheter is usually removed 2-3 days after surgery- the patient will come to clinic to have the catheter removed and make sure they can urinate on their own.  It is very important to drink lots of fluids after surgery for one week to keep the bladder flushed.  At first, there may be some burning with urination, but this typically improved within a few hours to days. Most patients do not have a significant amount of pain, and narcotic pain medications are rarely needed.  Symptoms of urinary frequency, urgency, and even leakage are NORMAL for the first few weeks after surgery as the bladder adjusts after having to work hard against blockage from the prostate for many years.  This will improve, but can sometimes take several months.  The use of pelvic floor exercises (Kegel exercises) can help improve problems with urinary incontinence.   After catheter removal, patients will be seen at 6 weeks and 6 months for symptom check  No heavy lifting for at least 2-3 weeks after surgery, however patients can walk and do light activities the first day after surgery. Return to work time  depends on occupation.    What are the advantages of HoLEP?  HoLEP has been studied in many different parts of the world and has been shown to be a safe and effective procedure. Although there are many types of BPH surgeries available, HoLEP offers a unique advantage in being able to remove a large amount of tissue without any incisions on the body, even in very large prostates, while decreasing the risk of bleeding and providing tissue for pathology (to look for cancer). This decreases the need for blood transfusions during surgery, minimizes hospital stay, and reduces the risk of needing repeat treatment.  What are the side effects of HoLEP?  Temporary burning and bleeding during urination. Some blood may be seen in the urine for weeks after surgery and is part of the healing process.  Urinary incontinence (inability to control urine flow) is expected in all patients immediately after surgery and they should wear pads for the first few days/weeks. This typically improves over the course of several weeks. Performing Kegel exercises can help decrease leakage from stress maneuvers such as coughing, sneezing, or lifting. The rate of long term leakage is very low. Patients may also have leakage with urgency and this may be treated with medication. The risk of urge incontinence can be dependent on several factors including age, prostate size, symptoms, and other medical problems.  Retrograde ejaculation or "backwards ejaculation." In 75% of cases, the patient will not see any fluid during ejaculation after surgery.  Erectile function is generally not significantly affected.   What are the risks of HoLEP?  Injury to the urethra or development of scar tissue at a later date  Injury to the capsule of the prostate (typically treated with longer catheterization).  Injury to the bladder or ureteral orifices (where the urine from the kidney drains out)  Infection of the bladder, testes, or kidneys   Return of urinary obstruction at a later date requiring another operation (<2%)  Need for blood transfusion or re-operation due to bleeding  Failure to relieve all symptoms and/or need for prolonged catheterization after surgery  5-15% of patients are found to have previously undiagnosed prostate cancer in their specimen. Prostate cancer can be treated after HoLEP.  Standard risks of anesthesia including blood clots, heart attacks, etc  When should I call my doctor?  Fever over 101.3 degrees  Inability to urinate, or large blood clots in the urine     AMBULATORY SURGERY  DISCHARGE INSTRUCTIONS   The drugs that you were given will stay in your system until tomorrow so for the next 24 hours you should not:  Drive an automobile Make any legal decisions Drink any alcoholic beverage   You may resume regular meals tomorrow.  Today it is better to start with liquids and gradually work up to solid foods.  You may eat anything you prefer, but it is better to start with liquids, then soup and crackers, and gradually work up to solid foods.   Please notify your doctor immediately if you have any unusual bleeding, trouble breathing, redness and pain at  the surgery site, drainage, fever, or pain not relieved by medication.    Additional Instructions:        Please contact your physician with any problems or Same Day Surgery at [phone], Monday through Friday 6 am to 4 pm, or [place] at [facility] number at [phone].

## 2021-02-16 NOTE — Anesthesia Preprocedure Evaluation (Addendum)
Anesthesia Evaluation  Patient identified by MRN, date of birth, ID band Patient awake    Reviewed: Allergy & Precautions, NPO status , Patient's Chart, lab work & pertinent test results  History of Anesthesia Complications Negative for: history of anesthetic complications  Airway Mallampati: II  TM Distance: >3 FB Neck ROM: Full    Dental no notable dental hx.    Pulmonary neg pulmonary ROS, neg sleep apnea, neg COPD,    breath sounds clear to auscultation- rhonchi (-) wheezing      Cardiovascular Exercise Tolerance: Good (-) hypertension(-) CAD and (-) Past MI negative cardio ROS   Rhythm:Regular Rate:Normal - Systolic murmurs and - Diastolic murmurs    Neuro/Psych negative neurological ROS  negative psych ROS   GI/Hepatic negative GI ROS, Neg liver ROS,   Endo/Other  negative endocrine ROSneg diabetes  Renal/GU negative Renal ROS   BPH    Musculoskeletal negative musculoskeletal ROS (+)   Abdominal (+) + obese,   Peds  Hematology negative hematology ROS (+)   Anesthesia Other Findings Past Medical History: No date: Elevated PSA No date: Hyperlipidemia No date: Obesity   Reproductive/Obstetrics                            Anesthesia Physical  Anesthesia Plan  ASA: 2  Anesthesia Plan: General   Post-op Pain Management:    Induction: Intravenous  PONV Risk Score and Plan: 1 and Ondansetron and Dexamethasone  Airway Management Planned: Oral ETT  Additional Equipment: None  Intra-op Plan:   Post-operative Plan:   Informed Consent: I have reviewed the patients History and Physical, chart, labs and discussed the procedure including the risks, benefits and alternatives for the proposed anesthesia with the patient or authorized representative who has indicated his/her understanding and acceptance.     Dental advisory given  Plan Discussed with: CRNA, Anesthesiologist  and Surgeon  Anesthesia Plan Comments:        Anesthesia Quick Evaluation

## 2021-02-16 NOTE — Interval H&P Note (Signed)
History and Physical Interval Note:  02/16/2021 12:37 PM  Riley Peterson  has presented today for surgery, with the diagnosis of BPH with Frequency.  The various methods of treatment have been discussed with the patient and family. After consideration of risks, benefits and other options for treatment, the patient has consented to  Procedure(s): [place] WITH MORCELLATION (N/A) as a surgical intervention.  The patient's history has been reviewed, patient examined, no change in status, stable for surgery.  I have reviewed the patient's chart and labs.  Questions were answered to the patient's satisfaction.    RRR CTAB  Hollice Espy

## 2021-02-17 ENCOUNTER — Encounter: Payer: Self-pay | Admitting: Urology

## 2021-02-17 NOTE — Anesthesia Postprocedure Evaluation (Signed)
Anesthesia Post Note  Patient: Riley Peterson  Procedure(s) Performed: HOLEP-LASER ENUCLEATION OF THE PROSTATE WITH MORCELLATION (Prostate)  Patient location during evaluation: PACU Anesthesia Type: General Level of consciousness: awake and alert Pain management: pain level controlled Vital Signs Assessment: post-procedure vital signs reviewed and stable Respiratory status: spontaneous breathing, nonlabored ventilation and respiratory function stable Cardiovascular status: blood pressure returned to baseline and stable Postop Assessment: no apparent nausea or vomiting Anesthetic complications: no   No notable events documented.   Last Vitals:  Vitals:   02/16/21 1659 02/16/21 1735  BP: 122/75 127/65  Pulse: 73 62  Resp: 16 18  Temp: (!) 36.1 C   SpO2: 100% 100%    Last Pain:  Vitals:   02/17/21 0837  TempSrc:   PainSc: 0-No pain                 [place] Ouch

## 2021-02-18 ENCOUNTER — Ambulatory Visit (INDEPENDENT_AMBULATORY_CARE_PROVIDER_SITE_OTHER): Payer: BC Managed Care – PPO | Admitting: Physician Assistant

## 2021-02-18 ENCOUNTER — Other Ambulatory Visit: Payer: Self-pay

## 2021-02-18 ENCOUNTER — Ambulatory Visit: Payer: BC Managed Care – PPO | Admitting: Physician Assistant

## 2021-02-18 DIAGNOSIS — R350 Frequency of micturition: Secondary | ICD-10-CM

## 2021-02-18 DIAGNOSIS — N401 Enlarged prostate with lower urinary tract symptoms: Secondary | ICD-10-CM

## 2021-02-18 LAB — BLADDER SCAN AMB NON-IMAGING

## 2021-02-18 NOTE — Progress Notes (Signed)
Afternoon follow-up  Patient returned to clinic this afternoon for repeat PVR. He reports drinking approximately 48oz of fluid. He has been able to urinate. He has had urinary leakage. PVR 75m.  Results for orders placed or performed in visit on 02/18/21  BLADDER SCAN AMB NON-IMAGING  Result Value Ref Range   Scan Result 066m   Voiding trial passed. Counseled patient on normal postoperative findings including dysuria, gross hematuria, and urinary urgency/leakage.  Counseled him to wear absorbent underwear for security as needed and resume normal fluid intake.  Counseled him to start Kegel exercises 3x10 sets daily to increase urinary control.  Written and verbal instructions provided today.  Surgical pathology pending, will defer to Dr. BrErlene Quano share results when they become available.  Follow up: 6 weeks with Dr. BrErlene Quanalready scheduled

## 2021-02-18 NOTE — Patient Instructions (Signed)
Congratulations on your recent HOLEP procedure! As discussed in clinic today, there are three main side effects that commonly occur after surgery: Burning or pain with urination: This typically resolves within 1 week of surgery. If you are still having significant pain with urination 10 days after surgery, please call our clinic. We may need to check you for a urinary tract infection at that point, though this is rare. Blood in the urine: This may come and go, but typically resolves completely within 3 weeks of surgery. If you are on blood thinners, it may take longer for the bleeding to resolve. As long as your urine remains thin and runny and you are not passing large clots (around the size of your palm), this is a normal postoperative finding. If you start to pass dark red urine or thick, ketchup-like urine, please call our office immediately. Urinary leakage or urgency: This tends to improve with time, with most patients becoming dry within around 3 months of surgery. You may wear absorbant underwear or liners for security during this time. To help you get dry faster, please make sure you are completing your Kegel exercises as instructed, with a set of 10 exercises completed up to three times daily.  

## 2021-02-18 NOTE — Progress Notes (Signed)
Catheter Removal  Patient is present today for a catheter removal.  68m of water was drained from the balloon. A 20FR foley cath was removed from the bladder no complications were noted . Patient tolerated well.  Performed by: SDebroah Loop PA-C   Follow up/ Additional notes: Push fluids and RTC this afternoon for PVR.

## 2021-02-19 LAB — SURGICAL PATHOLOGY

## 2021-02-23 ENCOUNTER — Telehealth: Payer: Self-pay | Admitting: *Deleted

## 2021-02-23 NOTE — Telephone Encounter (Addendum)
Left patient VM with details asked to return call with any questions.    ----- Message from Hollice Espy, MD sent at 02/20/2021 10:05 AM EDT ----- Please let this patient know that his surgical pathology was benign, there is no cancer in the specimen which is great news.  Hollice Espy, MD

## 2021-02-24 ENCOUNTER — Encounter: Payer: Self-pay | Admitting: Gastroenterology

## 2021-03-05 ENCOUNTER — Other Ambulatory Visit: Payer: Self-pay | Admitting: Family Medicine

## 2021-03-30 NOTE — Progress Notes (Signed)
03/31/21 3:46 PM  Riley Peterson May 04, 1956 TO:[id number]  Referring provider:  Lesleigh Noe, MD [address] [place],  [place] [address] Chief Complaint  Patient presents with   Benign Prostatic Hypertrophy     HPI: Riley Peterson is a 65 y.o.male a personal history of BPH and urinary symptoms who returns today for 6 week follow-up for IPSS and PVR.   He recently underwent HoLEP 02/16/2021. Surgical pathology revealed benign prostatic glandular and stromal hyperplasia, 87 g resection.  He passed voiding trial on 02/18/2021.   Notably, he was initially seen and evaluated for elevated PSA which has slowly risen.  He underwent prostate biopsy which was negative as well as significantly enlarged prostate, 214 cc by TRUS with a median lobe.  He is doing well today. He reports today that he is leaking. When he does kegel exercises he has pain from the tip of his penis to the back.   He reports today the he feels like his penis is turtling and has been experiencing pain due to this. He is interested in Viagra due to this, he has not experienced an erection recently.   He states that he soaks his depends through the night and daytime since the surgery. He was aware this this is common but still is frustrated.     IPSS     Row Name 03/31/21 1500         International Prostate Symptom Score   How often have you had the sensation of not emptying your bladder? Less than half the time     How often have you had to urinate less than every two hours? Less than half the time     How often have you found you stopped and started again several times when you urinated? Less than half the time     How often have you found it difficult to postpone urination? Not at All     How often have you had a weak urinary stream? Not at All     How often have you had to strain to start urination? Not at All     How many times did you typically get up at night to urinate? 3 Times     Total IPSS Score 9            Quality of Life due to urinary symptoms   If you were to spend the rest of your life with your urinary condition just the way it is now how would you feel about that? Mostly Satisfied              Score:  1-7 Mild 8-19 Moderate 20-35 Severe  PMH: Past Medical History:  Diagnosis Date   Benign prostatic hyperplasia with incomplete bladder emptying    BPH (benign prostatic hyperplasia)    Elevated PSA    Hyperlipidemia    Obesity    compulsive overeater    Surgical History: Past Surgical History:  Procedure Laterality Date   CHOLECYSTECTOMY     COLONOSCOPY WITH PROPOFOL N/A 01/07/2021   Procedure: COLONOSCOPY WITH PROPOFOL;  Surgeon: Lin Landsman, MD;  Location: [facility] ENDOSCOPY;  Service: Gastroenterology;  Laterality: N/A;   HOLEP-LASER ENUCLEATION OF THE PROSTATE WITH MORCELLATION N/A 02/16/2021   Procedure: HOLEP-LASER ENUCLEATION OF THE PROSTATE WITH MORCELLATION;  Surgeon: Hollice Espy, MD;  Location: [facility] ORS;  Service: Urology;  Laterality: N/A;   KIDNEY STONE SURGERY     PROSTATE BIOPSY  12/2020   negative  TONSILLECTOMY     VASECTOMY      Home Medications:  Allergies as of 03/31/2021       Reactions   Penicillins Other (See Comments)   Childhood allergy        Medication List        Accurate as of March 31, 2021  3:46 PM. If you have any questions, ask your nurse or doctor.          STOP taking these medications    finasteride 5 MG tablet Commonly known as: PROSCAR Stopped by: Hollice Espy, MD   HYDROcodone-acetaminophen 5-325 MG tablet Commonly known as: NORCO/VICODIN Stopped by: Hollice Espy, MD   oxybutynin 5 MG tablet Commonly known as: DITROPAN Stopped by: Hollice Espy, MD   tamsulosin 0.4 MG Caps capsule Commonly known as: FLOMAX Stopped by: Hollice Espy, MD       TAKE these medications    atorvastatin 10 MG tablet Commonly known as: LIPITOR Take 1 tablet (10 mg total) by mouth daily. What  changed: when to take this   B-complex with vitamin C tablet Take 1 tablet by mouth daily.   ibuprofen 200 MG tablet Commonly known as: ADVIL Take 600 mg by mouth every 8 (eight) hours as needed (pain.).   sildenafil 20 MG tablet Commonly known as: REVATIO Take 3-5 tablets one hour prior to intercourse Started by: Hollice Espy, MD        Allergies:  Allergies  Allergen Reactions   Penicillins Other (See Comments)    Childhood allergy    Family History: Family History  Problem Relation Age of Onset   Stroke Mother 24   Transient ischemic attack Mother    Diabetes Mother    Stroke Father 38   Hypertension Father    Pancreatic cancer Maternal Uncle 33   Rheum arthritis Paternal Aunt    Epilepsy Paternal Uncle    Throat cancer Paternal Uncle    Stroke Maternal Grandmother    Stroke Paternal Grandmother     Social History:  reports that he has never smoked. He has never used smokeless tobacco. He reports current alcohol use. He reports that he does not use drugs.   Physical Exam: BP (!) 145/87   Pulse 85   Ht '6\' 1"'$  (1.854 m)   Wt 270 lb (122.5 kg)   BMI 35.62 kg/m   Constitutional:  Alert and oriented, No acute distress. HEENT: [place] AT, moist mucus membranes.  Trachea midline, no masses. Cardiovascular: No clubbing, cyanosis, or edema. Respiratory: Normal respiratory effort, no increased work of breathing. Skin: No rashes, bruises or suspicious lesions. Neurologic: Grossly intact, no focal deficits, moving all 4 extremities. Psychiatric: Normal mood and affect.  Laboratory Data:  Lab Results  Component Value Date   CREATININE 0.78 10/21/2020    Lab Results  Component Value Date   PSA 4.16 (H) 10/21/2020   PSA 3.79 03/22/2018   PSA 2.90 11/24/2015    Lab Results  Component Value Date   HGBA1C 5.3 10/21/2020     Pertinent Imaging: Results for orders placed or performed in visit on 03/31/21  Bladder Scan (Post Void Residual) in office  Result  Value Ref Range   Scan Result 20       Assessment & Plan:   BPH with Urinary frequency  - S/p HoLEP 02/16/2021 -pathology is benign - IPSS score is dramatically improved from preop with improved stream and frequency - emptying well  - stop all BPH medications   2. Stress incontinence  -  severe but do think he will see improvement with time, reassured -recommend weight loss  - Referal pelvic floor physical therapy,   3. ED - Has not had erection since procedure  - Requested sildenafil    F/u 6 months with PSA/ IPSS/ PVR  I,Kailey Littlejohn,acting as a scribe for Hollice Espy, MD.,have documented all relevant documentation on the behalf of Hollice Espy, MD,as directed by  Hollice Espy, MD while in the presence of Hollice Espy, MD.  I have reviewed the above documentation for accuracy and completeness, and I agree with the above.   Hollice Espy, MD   [facility] [address] [place], [place] [address] [phone]

## 2021-03-31 ENCOUNTER — Ambulatory Visit (INDEPENDENT_AMBULATORY_CARE_PROVIDER_SITE_OTHER): Payer: BC Managed Care – PPO | Admitting: Urology

## 2021-03-31 ENCOUNTER — Other Ambulatory Visit: Payer: Self-pay

## 2021-03-31 ENCOUNTER — Encounter: Payer: Self-pay | Admitting: Urology

## 2021-03-31 VITALS — BP 145/87 | HR 85 | Ht 73.0 in | Wt 270.0 lb

## 2021-03-31 DIAGNOSIS — N401 Enlarged prostate with lower urinary tract symptoms: Secondary | ICD-10-CM

## 2021-03-31 DIAGNOSIS — R350 Frequency of micturition: Secondary | ICD-10-CM | POA: Diagnosis not present

## 2021-03-31 DIAGNOSIS — R972 Elevated prostate specific antigen [PSA]: Secondary | ICD-10-CM

## 2021-03-31 LAB — BLADDER SCAN AMB NON-IMAGING: Scan Result: 20

## 2021-03-31 MED ORDER — SILDENAFIL CITRATE 20 MG PO TABS: ORAL_TABLET | ORAL | 6 refills | Status: DC

## 2021-06-22 ENCOUNTER — Ambulatory Visit: Payer: BC Managed Care – PPO | Attending: Urology | Admitting: Physical Therapy

## 2021-06-22 ENCOUNTER — Encounter: Payer: Self-pay | Admitting: Physical Therapy

## 2021-06-22 ENCOUNTER — Other Ambulatory Visit: Payer: Self-pay

## 2021-06-22 DIAGNOSIS — R2689 Other abnormalities of gait and mobility: Secondary | ICD-10-CM | POA: Diagnosis not present

## 2021-06-22 DIAGNOSIS — R293 Abnormal posture: Secondary | ICD-10-CM | POA: Insufficient documentation

## 2021-06-22 DIAGNOSIS — R278 Other lack of coordination: Secondary | ICD-10-CM | POA: Diagnosis present

## 2021-06-22 NOTE — Therapy (Signed)
[place] MAIN [facility] SERVICES [address] [place], [place], [address] Phone: [phone]   Fax:  [phone]  Physical Therapy Evaluation  Patient Details  Name: Riley Peterson MRN: [id number] Date of Birth: Aug 26, 1955 Referring Provider (PT): Erlene Quan   Encounter Date: 06/22/2021   PT End of Session - 06/22/21 1013     Visit Number 1    Number of Visits 10    Date for PT Re-Evaluation 08/31/21   eval 06/22/21   PT Start Time 1007    PT Stop Time 1100    PT Time Calculation (min) 53 min    Activity Tolerance Patient tolerated treatment well;No increased pain    Behavior During Therapy WFL for tasks assessed/performed             Past Medical History:  Diagnosis Date   Benign prostatic hyperplasia with incomplete bladder emptying    BPH (benign prostatic hyperplasia)    Elevated PSA    Hyperlipidemia    Obesity    compulsive overeater    Past Surgical History:  Procedure Laterality Date   CHOLECYSTECTOMY     COLONOSCOPY WITH PROPOFOL N/A 01/07/2021   Procedure: COLONOSCOPY WITH PROPOFOL;  Surgeon: Lin Landsman, MD;  Location: [facility] ENDOSCOPY;  Service: Gastroenterology;  Laterality: N/A;   HOLEP-LASER ENUCLEATION OF THE PROSTATE WITH MORCELLATION N/A 02/16/2021   Procedure: HOLEP-LASER ENUCLEATION OF THE PROSTATE WITH MORCELLATION;  Surgeon: Hollice Espy, MD;  Location: [facility] ORS;  Service: Urology;  Laterality: N/A;   KIDNEY STONE SURGERY     PROSTATE BIOPSY  12/2020   negative   TONSILLECTOMY     VASECTOMY      There were no vitals filed for this visit.    Subjective Assessment - 06/22/21 1014     Subjective 1)  Urinary leakage started after 02/16/21 for HOLEP. After catherization was removed, he started wearing diaper s.  Pt goes through 6 diapers per day/ night. (more overnight than day). Pt sits at a desk and does not feel he has leaked. Pt moves furniture, lifting at work.  There is more leakage when laying  down.   Pt tried kegels but they are working.  Leakage occurs with coughing/ sneezing/ bending.  Nocturia 3x/ night. Prior to surgery pt urinated 6 x night. Pt has not been tested for sleep apnea.      2)  Cramping pain and burning pain when standing to urinate. Pt expereinces dribbling after urination.  Bowel movements are daily without straining. Pt feels bowel movement are not completely done and he has to wait.  3) LBP non-radiating pain occurs 7/10 level upon waking, walking dog, bending     Pt used to be a long distance biking. 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 Stress eating was a method he used to handle stress. Pt has a pool at home but exercises are not consistent.     Patient Stated Goals to not leak                [facility] PT Assessment - 06/22/21 1054       Assessment   Medical Diagnosis Urinary incontinence    Referring Provider (PT) Erlene Quan      Precautions   Precautions None      Restrictions   Weight Bearing Restrictions No      Balance Screen   Has the patient fallen in  the past 6 months No      Coordination   Coordination and Movement Description chest breathing ,  L sideflexion less than R      Palpation   SI assessment  L iliac crest higher,    Palpation comment scar restriction upper R Q where gallbladder was removed      Bed Mobility   Bed Mobility --   breathholding                       Objective measurements completed on examination: See above findings.     Pelvic Floor Special Questions - 06/22/21 1854     Diastasis Recti neg    External Palpation overuse of ab with cue for pelvic floor contraction in seated position              [facility] Adult PT Treatment/Exercise - 06/22/21 1054       Therapeutic Activites    Other Therapeutic Activities explained  pelvic rehab, anatomy/  physiology of deep core      Neuro Re-ed    Neuro Re-ed Details   cued for body mechanics to minimize pelvic floor/ abdominal                          PT Long Term Goals - 06/22/21 1857       PT LONG TERM GOAL #1   Title Pt will demo  proper deep core coordination without chest breathing and optimal excursion of diaphragm/pelvic floor    Time 4    Period Weeks    Status New    Target Date 07/20/21      PT LONG TERM GOAL #2   Title Pt will demo IND with pelvic floor exercises in supine and seated positions to minimize risk for urinary incontinence    Time 6    Period Weeks    Status New    Target Date 08/03/21      PT LONG TERM GOAL #3   Title Pt will demo proper alignment and technique with sitting, logrolling, sit to stand, lifting, body mechanics with ADLs to minimize straining of abdominopelvic floor mm and spine.    Time 2    Period Weeks    Status New    Target Date 07/06/21      PT LONG TERM GOAL #4   Title Pt will improve FOTO Urinary Problem 40 pt , PFDI 29 pts score by 5 change in order to improve QOL    Time 10    Period Weeks    Status New    Target Date 08/31/21      PT LONG TERM GOAL #5   Title Pt will report walking dog without LBP across 3 days in order to improve postural stability and improve QOL    Time 10    Period Weeks    Status New    Target Date 08/31/21      Additional Long Term Goals   Additional Long Term Goals --      PT LONG TERM GOAL #6   Title Pt will communicate with PCP to get sleep study to address nocturia    Time 2    Period Weeks    Status New    Target Date 07/06/21      PT LONG TERM GOAL #7   Title Pt will report decreased diaper wear from 6 x day / night to < 4 x  pads in order to improve continence    Time 8    Period Weeks    Status New    Target Date 08/17/21                    Plan - 06/22/21 1446     Clinical Impression Statement  Pt is a  65  yo  who presents with urinary incontinence, burning and cramping with urination, and non-radiating LBP which  impacts QOL. Pt is s/p HOLEP 4 months.   Pt's musculoskeletal assessment revealed uneven pelvic girdle height, limited spinal /pelvic mobility, dyscoordination and strength of pelvic floor mm, abdominal scar restrictions, poor body mechanics which places strain on the abdominal/pelvic floor mm.   These are deficits that indicate an ineffective intraabdominal pressure system associated with increased risk for pt's Sx.   Pt was provided education on etiology of Sx with anatomy, physiology explanation with images along with the benefits of customized pelvic PT Tx based on pt's medical conditions and musculoskeletal deficits.  Explained the physiology of deep core mm coordination and roles of pelvic floor function in urination, defecation, sexual function, and postural control with deep core mm system.   Pt will benefit from skilled PT.     Examination-Activity Limitations Continence;Toileting;Stand    Stability/Clinical Decision Making Evolving/Moderate complexity    Clinical Decision Making Moderate    Rehab Potential Good    PT Frequency 1x / week    PT Duration Other (comment)   10   PT Treatment/Interventions ADLs/Self Care Home Management;Moist Heat;Traction;Functional mobility training;Therapeutic activities;Therapeutic exercise;Neuromuscular re-education;Balance training;Manual techniques;Patient/family education;Taping;Splinting;Energy conservation;Scar mobilization;Joint Manipulations;Biofeedback;Cryotherapy;Gait training;Stair training    Consulted and Agree with Plan of Care Patient             Patient will benefit from skilled therapeutic intervention in order to improve the following deficits and impairments:  Decreased activity tolerance, Decreased balance, Difficulty walking, Decreased endurance, Cardiopulmonary status limiting activity, Decreased coordination, Abnormal gait, Increased muscle spasms, Decreased scar mobility, Decreased strength, Decreased range of motion,  Hypomobility, Improper body mechanics, Pain, Postural dysfunction, Decreased safety awareness, Decreased mobility  Visit Diagnosis: Other abnormalities of gait and mobility  Other lack of coordination  Abnormal posture     Problem List Patient Active Problem List   Diagnosis Date Noted   Encounter for screening colonoscopy    Right hip pain 10/21/2020   Knee pain, bilateral 01/03/2017   Lower urinary tract symptoms (LUTS) 05/22/2014   HLD (hyperlipidemia) 02/05/2014   MORBID OBESITY 08/11/2010   BPH (benign prostatic hyperplasia) 08/11/2010    Jerl Mina, PT 06/22/2021, 7:08 PM  [place] MAIN [facility] SERVICES [address] [place], [place], [address] Phone: [phone]   Fax:  [phone]  Name: Riley Peterson MRN: [id number] Date of Birth: 1956-03-24

## 2021-06-22 NOTE — Patient Instructions (Addendum)
  Avoid straining pelvic floor, abdominal muscles , spine  Use log rolling technique instead of getting out of bed with your neck or the sit-up     Log rolling into and out of bed   Log rolling into and out of bed If getting out of bed on R side, Bent knees, scoot hips/ shoulder to L  Raise R arm completely overhead, rolling onto armpit  Then lower bent knees to bed to get into complete side lying position  Then drop legs off bed, and push up onto R elbow/forearm, and use L hand to push onto the bed  __   Proper body mechanics with getting out of a chair to decrease strain  on back &pelvic floor   Avoid holding your breath when Getting out of the chair:  Scoot to front part of chair chair Heels behind knees, feet are hip width apart, nose over toes  Inhale like you are smelling roses Exhale to stand   ___  Sitting with feet on ground, not crossed   ___  Contact PCP for sleep study  Read articles on OSA and nocturia

## 2021-06-24 ENCOUNTER — Ambulatory Visit (INDEPENDENT_AMBULATORY_CARE_PROVIDER_SITE_OTHER): Payer: BC Managed Care – PPO | Admitting: Family

## 2021-06-24 ENCOUNTER — Encounter: Payer: Self-pay | Admitting: Family

## 2021-06-24 ENCOUNTER — Other Ambulatory Visit: Payer: Self-pay

## 2021-06-24 VITALS — BP 122/78 | HR 77 | Temp 97.8°F | Ht 73.0 in | Wt 306.0 lb

## 2021-06-24 DIAGNOSIS — R5383 Other fatigue: Secondary | ICD-10-CM | POA: Diagnosis not present

## 2021-06-24 DIAGNOSIS — R0683 Snoring: Secondary | ICD-10-CM | POA: Insufficient documentation

## 2021-06-24 NOTE — Patient Instructions (Signed)
Make appt for lab work today before leaving. I will notify you of your results once received.    It was a pleasure seeing you today! Please do not hesitate to reach out with any questions and or concerns.  Regards,   Eugenia Pancoast

## 2021-06-24 NOTE — Progress Notes (Signed)
Established Patient Office Visit  Subjective:  Patient ID: Riley Peterson, male    DOB: [birth year]  Age: 64 y.o. MRN: [id number]  CC:  Chief Complaint  Patient presents with   sleep study referral    HPI Riley Peterson is here today with c/o   Gets up often to go pee, about  Recently underwent HoLEP procedure 02/16/21 and has been incontinent since the procedure, wearing diapers daily. Has a f/u with urology in march, urologist is aware.  He does state he has been known to snore on occasion. He is morbidly obese. He has nodded off once or twice in the past few months, but not during driving. He does drink five hour energy drinks at times to keep awake. He has a larger than average neck at 18 inch and he often feels tired and sleepy during the day yawning often.  Past Medical History:  Diagnosis Date   Benign prostatic hyperplasia with incomplete bladder emptying    BPH (benign prostatic hyperplasia)    Elevated PSA    Hyperlipidemia    Obesity    compulsive overeater    Past Surgical History:  Procedure Laterality Date   CHOLECYSTECTOMY     COLONOSCOPY WITH PROPOFOL N/A 01/07/2021   Procedure: COLONOSCOPY WITH PROPOFOL;  Surgeon: Lin Landsman, MD;  Location: [facility] ENDOSCOPY;  Service: Gastroenterology;  Laterality: N/A;   HOLEP-LASER ENUCLEATION OF THE PROSTATE WITH MORCELLATION N/A 02/16/2021   Procedure: HOLEP-LASER ENUCLEATION OF THE PROSTATE WITH MORCELLATION;  Surgeon: Hollice Espy, MD;  Location: [facility] ORS;  Service: Urology;  Laterality: N/A;   KIDNEY STONE SURGERY     PROSTATE BIOPSY  12/2020   negative   TONSILLECTOMY     VASECTOMY      Family History  Problem Relation Age of Onset   Stroke Mother 52   Transient ischemic attack Mother    Diabetes Mother    Stroke Father 22   Hypertension Father    Pancreatic cancer Maternal Uncle 33   Rheum arthritis Paternal Aunt    Epilepsy Paternal Uncle    Throat cancer Paternal Uncle    Stroke Maternal  Grandmother    Stroke Paternal Grandmother     Social History   Socioeconomic History   Marital status: Married    Spouse name: Rise Paganini   Number of children: 2   Years of education: Airline pilot   Highest education level: Not on file  Occupational History   Occupation: Mining engineer: [employer]  Tobacco Use   Smoking status: Never   Smokeless tobacco: Never  Substance and Sexual Activity   Alcohol use: Yes    Comment: rare, 1 glass of wine occasionally   Drug use: No   Sexual activity: Not Currently    Birth control/protection: Surgical  Other Topics Concern   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   Living: with wife Rise Paganini   Work: Theme park manager at [employer]      Family: adult children - Ria Comment and Thurmond Butts - 2 grandchildren (in [place])       Enjoys: bicycling and spending time with grandchildren      Exercise: has home equipment - doing some exercise on and off   Diet: when on the program - veggies/proteins - no flour/sugar      Safety   Seat belts: Yes    Guns: No   Safe in  relationships: Yes    Social Determinants of Radio broadcast assistant Strain: Not on file  Food Insecurity: Not on file  Transportation Needs: Not on file  Physical Activity: Not on file  Stress: Not on file  Social Connections: Not on file  Intimate Partner Violence: Not on file    Outpatient Medications Prior to Visit  Medication Sig Dispense Refill   atorvastatin (LIPITOR) 10 MG tablet Take 1 tablet (10 mg total) by mouth daily. (Patient taking differently: Take 10 mg by mouth in the morning.) 90 tablet 3   sildenafil (REVATIO) 20 MG tablet Take 3-5 tablets one hour prior to intercourse (Patient not taking: Reported on 06/24/2021) 30 tablet 6   B Complex-C (B-COMPLEX WITH VITAMIN C) tablet Take 1 tablet by mouth daily.     ibuprofen (ADVIL) 200 MG tablet Take 600 mg by mouth every 8  (eight) hours as needed (pain.).     No facility-administered medications prior to visit.    Allergies  Allergen Reactions   Penicillins Other (See Comments)    Childhood allergy    ROS Review of Systems  Constitutional:  Positive for fatigue.  Respiratory:  Negative for apnea, cough, choking and shortness of breath.      Objective:    Physical Exam Constitutional:      General: He is not in acute distress.    Appearance: Normal appearance. He is obese. He is not ill-appearing.  HENT:     Head: Normocephalic.     Mouth/Throat:     Mouth: Mucous membranes are moist.     Tonsils: No tonsillar exudate or tonsillar abscesses. 0 on the right. 0 on the left.  Cardiovascular:     Rate and Rhythm: Normal rate and regular rhythm.  Pulmonary:     Effort: Pulmonary effort is normal.     Breath sounds: Normal breath sounds.  Musculoskeletal:     Cervical back: Normal range of motion and neck supple. No tenderness.  Neurological:     Mental Status: He is alert.    BP 122/78   Pulse 77   Temp 97.8 F (36.6 C) (Temporal)   Ht 6\' 1"  (1.854 m)   Wt (!) 306 lb (138.8 kg)   SpO2 98%   BMI 40.37 kg/m  Wt Readings from Last 3 Encounters:  06/24/21 (!) 306 lb (138.8 kg)  03/31/21 270 lb (122.5 kg)  02/13/21 270 lb (122.5 kg)     Health Maintenance Due  Topic Date Due   HIV Screening  Never done    There are no preventive care reminders to display for this patient.  Lab Results  Component Value Date   TSH 4.22 02/01/2014   Lab Results  Component Value Date   WBC 6.8 11/24/2015   HGB 14.4 11/24/2015   HCT 42.7 11/24/2015   MCV 85.9 11/24/2015   PLT 269.0 11/24/2015   Lab Results  Component Value Date   NA 137 10/21/2020   K 4.6 10/21/2020   CO2 30 10/21/2020   GLUCOSE 105 (H) 10/21/2020   BUN 14 10/21/2020   CREATININE 0.78 10/21/2020   BILITOT 0.6 10/21/2020   ALKPHOS 69 10/21/2020   AST 15 10/21/2020   ALT 16 10/21/2020   PROT 6.9 10/21/2020   ALBUMIN  4.5 10/21/2020   CALCIUM 9.5 10/21/2020   GFR 95.57 10/21/2020   Lab Results  Component Value Date   CHOL 160 10/21/2020   Lab Results  Component Value Date   HDL 40.60 10/21/2020  Lab Results  Component Value Date   LDLCALC 102 (H) 10/21/2020   Lab Results  Component Value Date   TRIG 89.0 10/21/2020   Lab Results  Component Value Date   CHOLHDL 4 10/21/2020   Lab Results  Component Value Date   HGBA1C 5.3 10/21/2020      Assessment & Plan:   Problem List Items Addressed This Visit       Other   MORBID OBESITY    Patient to work on diet and exercise he states he already has a plan in effect for the holidays.      Fatigue    Lab work ordered pending results patient also referred for sleep study.      Relevant Orders   CBC with Differential/Platelet   Comprehensive metabolic panel   TSH   Snoring - Primary    Stop bang questionnaire completed.  Referred patient for further eval treatment to lower pulmonary for possible sleep study.        Relevant Orders   Ambulatory referral to Pulmonology    No orders of the defined types were placed in this encounter.   Follow-up: Return in about 3 months (around 09/22/2021), or if symptoms worsen or fail to improve.    Eugenia Pancoast, FNP

## 2021-06-24 NOTE — Assessment & Plan Note (Signed)
Patient to work on diet and exercise he states he already has a plan in effect for the holidays.

## 2021-06-24 NOTE — Assessment & Plan Note (Signed)
Stop bang questionnaire completed.  Referred patient for further eval treatment to lower pulmonary for possible sleep study.

## 2021-06-24 NOTE — Assessment & Plan Note (Signed)
Lab work ordered pending results patient also referred for sleep study.

## 2021-07-01 ENCOUNTER — Ambulatory Visit: Payer: BC Managed Care – PPO | Attending: Urology | Admitting: Physical Therapy

## 2021-07-01 ENCOUNTER — Other Ambulatory Visit: Payer: Self-pay

## 2021-07-01 DIAGNOSIS — R278 Other lack of coordination: Secondary | ICD-10-CM | POA: Insufficient documentation

## 2021-07-01 DIAGNOSIS — M533 Sacrococcygeal disorders, not elsewhere classified: Secondary | ICD-10-CM | POA: Diagnosis not present

## 2021-07-01 DIAGNOSIS — R293 Abnormal posture: Secondary | ICD-10-CM | POA: Diagnosis present

## 2021-07-01 DIAGNOSIS — R2689 Other abnormalities of gait and mobility: Secondary | ICD-10-CM | POA: Diagnosis present

## 2021-07-01 NOTE — Patient Instructions (Signed)
  Avoid straining pelvic floor, abdominal muscles , spine  Use log rolling technique instead of getting out of bed with your neck or the sit-up     Log rolling into and out of bed   Log rolling into and out of bed If getting out of bed on R side, Bent knees, scoot hips/ shoulder to L  Raise R arm completely overhead, rolling onto armpit  Then lower bent knees to bed to get into complete side lying position  Then drop legs off bed, and push up onto R elbow/forearm, and use L hand to push onto the bed   __   for thoracic rotation: ( sidelying arm/ elbow pull on edge of mattress with shoulder depression)  (open book -dragging palm)   15 reps   ___  Bear stretch at doorway   ___

## 2021-07-01 NOTE — Therapy (Signed)
[place] MAIN [facility] SERVICES [address] [place], [place], [address] Phone: [phone]   Fax:  [phone]  Physical Therapy Treatment  Patient Details  Name: Riley Peterson MRN: [id number] Date of Birth: 1955/12/07 Referring Provider (PT): Erlene Quan   Encounter Date: 07/01/2021   PT End of Session - 07/01/21 1003     Visit Number 2    Number of Visits 10    Date for PT Re-Evaluation 08/31/21   eval 06/22/21   PT Start Time 0803    PT Stop Time 0900    PT Time Calculation (min) 57 min    Activity Tolerance Patient tolerated treatment well;No increased pain    Behavior During Therapy WFL for tasks assessed/performed             Past Medical History:  Diagnosis Date   Benign prostatic hyperplasia with incomplete bladder emptying    BPH (benign prostatic hyperplasia)    Elevated PSA    Hyperlipidemia    Obesity    compulsive overeater    Past Surgical History:  Procedure Laterality Date   CHOLECYSTECTOMY     COLONOSCOPY WITH PROPOFOL N/A 01/07/2021   Procedure: COLONOSCOPY WITH PROPOFOL;  Surgeon: Lin Landsman, MD;  Location: [facility] ENDOSCOPY;  Service: Gastroenterology;  Laterality: N/A;   HOLEP-LASER ENUCLEATION OF THE PROSTATE WITH MORCELLATION N/A 02/16/2021   Procedure: HOLEP-LASER ENUCLEATION OF THE PROSTATE WITH MORCELLATION;  Surgeon: Hollice Espy, MD;  Location: [facility] ORS;  Service: Urology;  Laterality: N/A;   KIDNEY STONE SURGERY     PROSTATE BIOPSY  12/2020   negative   TONSILLECTOMY     VASECTOMY      There were no vitals filed for this visit.   Subjective Assessment - 07/01/21 0810     Subjective It is taking practice for sitting posture and breathing to get out of bed.    Patient Stated Goals to not leak                [facility] PT Assessment - 07/01/21 1002       Coordination   Coordination and Movement Description improved diaphragmatic excursion post Tx      Palpation   Spinal mobility  L T/J convex curve, L iliac crest higher    Palpation comment hypomobile thoracic T10-12                           [facility] Adult PT Treatment/Exercise - 07/01/21 1002       Neuro Re-ed    Neuro Re-ed Details  cued for HEP, body scan/ relaxation training      Manual Therapy   Manual therapy comments STM/MWM at throacic spine to promote rotation/ address convex curve                          PT Long Term Goals - 06/22/21 1857       PT LONG TERM GOAL #1   Title Pt will demo  proper deep core coordination without chest breathing and optimal excursion of diaphragm/pelvic floor    Time 4    Period Weeks    Status New    Target Date 07/20/21      PT LONG TERM GOAL #2   Title Pt will demo IND with pelvic floor exercises in supine and seated positions to minimize risk for urinary incontinence    Time 6    Period Weeks  Status New    Target Date 08/03/21      PT LONG TERM GOAL #3   Title Pt will demo proper alignment and technique with sitting, logrolling, sit to stand, lifting, body mechanics with ADLs to minimize straining of abdominopelvic floor mm and spine.    Time 2    Period Weeks    Status New    Target Date 07/06/21      PT LONG TERM GOAL #4   Title Pt will improve FOTO Urinary Problem 40 pt , PFDI 29 pts score by 5 change in order to improve QOL    Time 10    Period Weeks    Status New    Target Date 08/31/21      PT LONG TERM GOAL #5   Title Pt will report walking dog without LBP across 3 days in order to improve postural stability and improve QOL    Time 10    Period Weeks    Status New    Target Date 08/31/21      Additional Long Term Goals   Additional Long Term Goals --      PT LONG TERM GOAL #6   Title Pt will communicate with PCP to get sleep study to address nocturia    Time 2    Period Weeks    Status New    Target Date 07/06/21      PT LONG TERM GOAL #7   Title Pt will report decreased diaper wear from 6 x day  / night to < 4 x pads in order to improve continence    Time 8    Period Weeks    Status New    Target Date 08/17/21                   Plan - 07/01/21 1003     Clinical Impression Statement  Following manual Tx which addressed thoracic convex curve, pt demo'd levelled iliac crest. Plan to address lumbar convex curve next session and progress to deep core HEP. Pt demo'd IND with body scan/ relaxation technique after which pt demo'd improved diaphragmatic excursion which prepares pt to do well with deep core training next session. Pt required cues for HEP and log rolling technique to minimize straining pelvic floor. Pt continues to benefit from skilled PT    Examination-Activity Limitations Continence;Toileting;Stand    Stability/Clinical Decision Making Evolving/Moderate complexity    Rehab Potential Good    PT Frequency 1x / week    PT Duration Other (comment)   10   PT Treatment/Interventions ADLs/Self Care Home Management;Moist Heat;Traction;Functional mobility training;Therapeutic activities;Therapeutic exercise;Neuromuscular re-education;Balance training;Manual techniques;Patient/family education;Taping;Splinting;Energy conservation;Scar mobilization;Joint Manipulations;Biofeedback;Cryotherapy;Gait training;Stair training    Consulted and Agree with Plan of Care Patient             Patient will benefit from skilled therapeutic intervention in order to improve the following deficits and impairments:  Decreased activity tolerance, Decreased balance, Difficulty walking, Decreased endurance, Cardiopulmonary status limiting activity, Decreased coordination, Abnormal gait, Increased muscle spasms, Decreased scar mobility, Decreased strength, Decreased range of motion, Hypomobility, Improper body mechanics, Pain, Postural dysfunction, Decreased safety awareness, Decreased mobility  Visit Diagnosis: Other lack of coordination  Other abnormalities of gait and mobility  Abnormal  posture     Problem List Patient Active Problem List   Diagnosis Date Noted   Fatigue 06/24/2021   Snoring 06/24/2021   Right hip pain 10/21/2020   Knee pain, bilateral 01/03/2017   Lower urinary tract  symptoms (LUTS) 05/22/2014   HLD (hyperlipidemia) 02/05/2014   MORBID OBESITY 08/11/2010   BPH (benign prostatic hyperplasia) 08/11/2010    Jerl Mina, PT 07/01/2021, 10:04 AM  [place] MAIN [facility] SERVICES [address] [place], [place], [address] Phone: [phone]   Fax:  [phone]  Name: Riley Peterson MRN: [id number] Date of Birth: 1956/05/01

## 2021-07-02 ENCOUNTER — Other Ambulatory Visit (INDEPENDENT_AMBULATORY_CARE_PROVIDER_SITE_OTHER): Payer: BC Managed Care – PPO

## 2021-07-02 ENCOUNTER — Other Ambulatory Visit: Payer: Self-pay

## 2021-07-02 DIAGNOSIS — R5383 Other fatigue: Secondary | ICD-10-CM | POA: Diagnosis not present

## 2021-07-02 LAB — CBC WITH DIFFERENTIAL/PLATELET
Basophils Absolute: 0 10*3/uL (ref 0.0–0.1)
Basophils Relative: 0.6 % (ref 0.0–3.0)
Eosinophils Absolute: 0.1 10*3/uL (ref 0.0–0.7)
Eosinophils Relative: 2 % (ref 0.0–5.0)
HCT: 41 % (ref 39.0–52.0)
Hemoglobin: 13.8 g/dL (ref 13.0–17.0)
Lymphocytes Relative: 30.3 % (ref 12.0–46.0)
Lymphs Abs: 1.5 10*3/uL (ref 0.7–4.0)
MCHC: 33.6 g/dL (ref 30.0–36.0)
MCV: 82.2 fl (ref 78.0–100.0)
Monocytes Absolute: 0.5 10*3/uL (ref 0.1–1.0)
Monocytes Relative: 9.8 % (ref 3.0–12.0)
Neutro Abs: 2.8 10*3/uL (ref 1.4–7.7)
Neutrophils Relative %: 57.3 % (ref 43.0–77.0)
Platelets: 262 10*3/uL (ref 150.0–400.0)
RBC: 4.98 Mil/uL (ref 4.22–5.81)
RDW: 14.1 % (ref 11.5–15.5)
WBC: 4.9 10*3/uL (ref 4.0–10.5)

## 2021-07-02 LAB — TSH: TSH: 3.14 u[IU]/mL (ref 0.35–5.50)

## 2021-07-02 LAB — COMPREHENSIVE METABOLIC PANEL
ALT: 21 U/L (ref 0–53)
AST: 19 U/L (ref 0–37)
Albumin: 4.1 g/dL (ref 3.5–5.2)
Alkaline Phosphatase: 66 U/L (ref 39–117)
BUN: 17 mg/dL (ref 6–23)
CO2: 24 mEq/L (ref 19–32)
Calcium: 8.8 mg/dL (ref 8.4–10.5)
Chloride: 106 mEq/L (ref 96–112)
Creatinine, Ser: 0.83 mg/dL (ref 0.40–1.50)
GFR: 93.33 mL/min (ref >60.00)
Glucose, Bld: 110 mg/dL — ABNORMAL HIGH (ref 70–99)
Potassium: 4.5 mEq/L (ref 3.5–5.1)
Sodium: 137 mEq/L (ref 135–145)
Total Bilirubin: 0.6 mg/dL (ref 0.2–1.2)
Total Protein: 6.5 g/dL (ref 6.0–8.3)

## 2021-07-06 ENCOUNTER — Other Ambulatory Visit: Payer: Self-pay

## 2021-07-06 ENCOUNTER — Ambulatory Visit: Payer: BC Managed Care – PPO | Admitting: Physical Therapy

## 2021-07-06 DIAGNOSIS — R2689 Other abnormalities of gait and mobility: Secondary | ICD-10-CM

## 2021-07-06 DIAGNOSIS — R293 Abnormal posture: Secondary | ICD-10-CM

## 2021-07-06 DIAGNOSIS — R278 Other lack of coordination: Secondary | ICD-10-CM | POA: Diagnosis not present

## 2021-07-06 NOTE — Patient Instructions (Addendum)
Deep core level 1 and 2 ( handout)   Twice a day                            __ Wear shoe lift in R shoe                     Remove if it causes pain
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
## 2021-07-13 ENCOUNTER — Ambulatory Visit: Payer: BC Managed Care – PPO | Admitting: Physical Therapy

## 2021-07-13 ENCOUNTER — Other Ambulatory Visit: Payer: Self-pay

## 2021-07-13 DIAGNOSIS — R278 Other lack of coordination: Secondary | ICD-10-CM | POA: Diagnosis not present

## 2021-07-13 DIAGNOSIS — R293 Abnormal posture: Secondary | ICD-10-CM

## 2021-07-13 DIAGNOSIS — R2689 Other abnormalities of gait and mobility: Secondary | ICD-10-CM

## 2021-07-13 NOTE — Patient Instructions (Addendum)
° °  When urinating Standing with unlocked knees, 4 points of contact at feet  Relax pelvic floor by expanding diaphragm, soft long breath not into chest    --  4 points of contact in deep core  of feet

## 2021-07-13 NOTE — Therapy (Signed)
[place] MAIN [facility] SERVICES [address] [place], [place], [address] Phone: [phone]   Fax:  [phone]  Physical Therapy Treatment  Patient Details  Name: Riley Peterson MRN: [id number] Date of Birth: 1956-03-12 Referring Provider (PT): Erlene Quan   Encounter Date: 07/13/2021   PT End of Session - 07/13/21 1713     Visit Number 4    Number of Visits 10    Date for PT Re-Evaluation 08/31/21   eval 06/22/21   PT Start Time 4270    PT Stop Time 6237    PT Time Calculation (min) 60 min    Activity Tolerance Patient tolerated treatment well;No increased pain    Behavior During Therapy WFL for tasks assessed/performed             Past Medical History:  Diagnosis Date   Benign prostatic hyperplasia with incomplete bladder emptying    BPH (benign prostatic hyperplasia)    Elevated PSA    Hyperlipidemia    Obesity    compulsive overeater    Past Surgical History:  Procedure Laterality Date   CHOLECYSTECTOMY     COLONOSCOPY WITH PROPOFOL N/A 01/07/2021   Procedure: COLONOSCOPY WITH PROPOFOL;  Surgeon: Lin Landsman, MD;  Location: [facility];  Service: Gastroenterology;  Laterality: N/A;   HOLEP-LASER ENUCLEATION OF THE PROSTATE WITH MORCELLATION N/A 02/16/2021   Procedure: HOLEP-LASER ENUCLEATION OF THE PROSTATE WITH MORCELLATION;  Surgeon: Hollice Espy, MD;  Location: [facility] ORS;  Service: Urology;  Laterality: N/A;   KIDNEY STONE SURGERY     PROSTATE BIOPSY  12/2020   negative   TONSILLECTOMY     VASECTOMY      There were no vitals filed for this visit.   Subjective Assessment - 07/13/21 1711     Subjective Pt reported he has been doing his exercises in the morning. Pt reports R hip pain 1/10. L knee pain is resolved. L LBP is improving and he has not noticed the pain when sleeping. Pt wore the R shoe lift  without issues.    Patient Stated Goals to not leak                [facility] PT Assessment - 07/13/21  1817       Observation/Other Assessments   Observations hyperextended kness in standing posture,      Coordination   Coordination and Movement Description poor feet propioception in deep core and standing posture                           [facility] Adult PT Treatment/Exercise - 07/13/21 1818       Therapeutic Activites    Other Therapeutic Activities explained pelvic floor function when standing and seated to eliminate urinate and bowel movements      Neuro Re-ed    Neuro Re-ed Details  cued for feet propioception in standing/deep core to relax pelvic floor                          PT Long Term Goals - 06/22/21 1857       PT LONG TERM GOAL #1   Title Pt will demo  proper deep core coordination without chest breathing and optimal excursion of diaphragm/pelvic floor    Time 4    Period Weeks    Status New    Target Date 07/20/21      PT LONG TERM GOAL #2  Title Pt will demo IND with pelvic floor exercises in supine and seated positions to minimize risk for urinary incontinence    Time 6    Period Weeks    Status New    Target Date 08/03/21      PT LONG TERM GOAL #3   Title Pt will demo proper alignment and technique with sitting, logrolling, sit to stand, lifting, body mechanics with ADLs to minimize straining of abdominopelvic floor mm and spine.    Time 2    Period Weeks    Status New    Target Date 07/06/21      PT LONG TERM GOAL #4   Title Pt will improve FOTO Urinary Problem 40 pt , PFDI 29 pts score by 5 change in order to improve QOL    Time 10    Period Weeks    Status New    Target Date 08/31/21      PT LONG TERM GOAL #5   Title Pt will report walking dog without LBP across 3 days in order to improve postural stability and improve QOL    Time 10    Period Weeks    Status New    Target Date 08/31/21      Additional Long Term Goals   Additional Long Term Goals --      PT LONG TERM GOAL #6   Title Pt will communicate  with PCP to get sleep study to address nocturia    Time 2    Period Weeks    Status New    Target Date 07/06/21      PT LONG TERM GOAL #7   Title Pt will report decreased diaper wear from 6 x day / night to < 4 x pads in order to improve continence    Time 8    Period Weeks    Status New    Target Date 08/17/21                   Plan - 07/13/21 1820     Clinical Impression Statement Shoe lift in R shoe has helped to improve knee, hip, and LBP.  Provided explanation of pelvic floor function with coordination of deep core for elimination of urine and bowels which he has issues  with. Provided cues for propioception of feet  and pelvic alignment for optimal deep core alignment and pelvic floor function. Plan to assess pelvic floor next session to ensure upward movement of pelvic floor with TrA  co-activation. Pt continues to benefit from skilled PT    Examination-Activity Limitations Continence;Toileting;Stand    Stability/Clinical Decision Making Evolving/Moderate complexity    Rehab Potential Good    PT Frequency 1x / week    PT Duration Other (comment)   10   PT Treatment/Interventions ADLs/Self Care Home Management;Moist Heat;Traction;Functional mobility training;Therapeutic activities;Therapeutic exercise;Neuromuscular re-education;Balance training;Manual techniques;Patient/family education;Taping;Splinting;Energy conservation;Scar mobilization;Joint Manipulations;Biofeedback;Cryotherapy;Gait training;Stair training    Consulted and Agree with Plan of Care Patient             Patient will benefit from skilled therapeutic intervention in order to improve the following deficits and impairments:  Decreased activity tolerance, Decreased balance, Difficulty walking, Decreased endurance, Cardiopulmonary status limiting activity, Decreased coordination, Abnormal gait, Increased muscle spasms, Decreased scar mobility, Decreased strength, Decreased range of motion, Hypomobility,  Improper body mechanics, Pain, Postural dysfunction, Decreased safety awareness, Decreased mobility  Visit Diagnosis: Other lack of coordination  Other abnormalities of gait and mobility  Abnormal posture  Problem List Patient Active Problem List   Diagnosis Date Noted   Fatigue 06/24/2021   Snoring 06/24/2021   Right hip pain 10/21/2020   Knee pain, bilateral 01/03/2017   Lower urinary tract symptoms (LUTS) 05/22/2014   HLD (hyperlipidemia) 02/05/2014   MORBID OBESITY 08/11/2010   BPH (benign prostatic hyperplasia) 08/11/2010    Jerl Mina, PT 07/13/2021, 6:20 PM  [place] MAIN [facility] SERVICES [address] [place], [place], [address] Phone: [phone]   Fax:  [phone]  Name: Riley Peterson MRN: [id number] Date of Birth: 13-Mar-1956

## 2021-07-28 ENCOUNTER — Other Ambulatory Visit: Payer: Self-pay

## 2021-07-28 ENCOUNTER — Ambulatory Visit: Payer: BC Managed Care – PPO | Attending: Urology | Admitting: Physical Therapy

## 2021-07-28 DIAGNOSIS — R293 Abnormal posture: Secondary | ICD-10-CM | POA: Insufficient documentation

## 2021-07-28 DIAGNOSIS — R278 Other lack of coordination: Secondary | ICD-10-CM | POA: Diagnosis present

## 2021-07-28 DIAGNOSIS — R2689 Other abnormalities of gait and mobility: Secondary | ICD-10-CM | POA: Insufficient documentation

## 2021-07-28 NOTE — Patient Instructions (Signed)
Deep core level 1 ( notice feet firm)   - inhale, exhale, quick squeeze, no abdominal     (  Practice proper pelvic floor coordination  Inhale: expand pelvic floor muscles Exhale" "j" scoop, allow pelvic floor to close, lift first before belly sinks   ( not "draw abdominal muscle to spine" or strain with abdominal muscles")   __   Deep core level 2 without quick squeeze   ___    2 x day   ___   Practice relaxing and squeeze from pelvic floor ( no ab )  when urinating

## 2021-07-28 NOTE — Therapy (Signed)
[place] MAIN [facility] SERVICES [address] [place], [place], [address] Phone: [phone]   Fax:  [phone]  Physical Therapy Treatment  Patient Details  Name: Riley Peterson MRN: [id number] Date of Birth: 1956-04-25 Referring Provider (PT): Erlene Quan   Encounter Date: 07/28/2021   PT End of Session - 07/28/21 1621     Visit Number 5    Number of Visits 10    Date for PT Re-Evaluation 08/31/21   eval 06/22/21   PT Start Time 5697    PT Stop Time 1702    PT Time Calculation (min) 54 min    Activity Tolerance Patient tolerated treatment well;No increased pain    Behavior During Therapy WFL for tasks assessed/performed             Past Medical History:  Diagnosis Date   Benign prostatic hyperplasia with incomplete bladder emptying    BPH (benign prostatic hyperplasia)    Elevated PSA    Hyperlipidemia    Obesity    compulsive overeater    Past Surgical History:  Procedure Laterality Date   CHOLECYSTECTOMY     COLONOSCOPY WITH PROPOFOL N/A 01/07/2021   Procedure: COLONOSCOPY WITH PROPOFOL;  Surgeon: Lin Landsman, MD;  Location: [facility];  Service: Gastroenterology;  Laterality: N/A;   HOLEP-LASER ENUCLEATION OF THE PROSTATE WITH MORCELLATION N/A 02/16/2021   Procedure: HOLEP-LASER ENUCLEATION OF THE PROSTATE WITH MORCELLATION;  Surgeon: Hollice Espy, MD;  Location: [facility] ORS;  Service: Urology;  Laterality: N/A;   KIDNEY STONE SURGERY     PROSTATE BIOPSY  12/2020   negative   TONSILLECTOMY     VASECTOMY      There were no vitals filed for this visit.   Subjective Assessment - 07/28/21 1612     Subjective Pt is sensing when he has the urge to pee. Pt is not able to control his urine stream. Pt notices his penis is turtling.  Pt feels he is not emptying. Pt notices urine on the floor. Pt has to push his low abdomen. Pt is not getting consistent normal stream, sometimes it is weak and sometimes it is strong but  also sometimes it is in his diaper.  Pt has no thad any R hip/ L knee pain since wearing shoe lift in R shoe for the past 3 weeks.    Patient Stated Goals to not leak                            Pelvic Floor Special Questions - 07/28/21 1814     External Palpation sensation intact at low abdomen and perineum with Q-tip test, tightness noted at deep transverseperineal R, L bulbospongiosus / coccygeus / obt int .  abdominal overuse with cue for pelvic floor, Able to elicit proper quick squeeze 10reps after Tx and cues               [facility] Adult PT Treatment/Exercise - 07/28/21 1815       Therapeutic Activites    Other Therapeutic Activities explained mechanics of pelvic floor for initiating urination and completion with no straining of ab but upward movement of pelvic floor      Neuro Re-ed    Neuro Re-ed Details  cued for quick pelvicfloor contractions with less ab overuse. excessive cues for feet co-activation/ stability in deep core level 1-2 HEP      Modalities   Modalities Moist Heat  Moist Heat Therapy   Number Minutes Moist Heat 5 Minutes    Moist Heat Location --   perineum through sheets and pillow case, in butterfly pose, required propping of R knee for comfort     Manual Therapy   Manual therapy comments STM/MWM at probelm areas noted at pelvic floor with diaper donned                          PT Long Term Goals - 07/28/21 1818       PT LONG TERM GOAL #1   Title Pt will demo  proper deep core coordination without chest breathing and optimal excursion of diaphragm/pelvic floor    Time 4    Period Weeks    Status On-going    Target Date 07/20/21      PT LONG TERM GOAL #2   Title Pt will demo IND with pelvic floor exercises in supine and seated positions to minimize risk for urinary incontinence    Time 6    Period Weeks    Status On-going    Target Date 08/03/21      PT LONG TERM GOAL #3   Title Pt will demo proper  alignment and technique with sitting, logrolling, sit to stand, lifting, body mechanics with ADLs to minimize straining of abdominopelvic floor mm and spine.    Time 2    Period Weeks    Status Achieved    Target Date 07/06/21      PT LONG TERM GOAL #4   Title Pt will improve FOTO Urinary Problem 40 pt , PFDI 29 pts score by 5 change in order to improve QOL    Time 10    Period Weeks    Status On-going    Target Date 08/31/21      PT LONG TERM GOAL #5   Title Pt will report walking dog without LBP across 3 days in order to improve postural stability and improve QOL    Time 10    Period Weeks    Status On-going    Target Date 08/31/21      Additional Long Term Goals   Additional Long Term Goals Yes      PT LONG TERM GOAL #6   Title Pt will communicate with PCP to get sleep study to address nocturia    Time 2    Period Weeks    Status On-going    Target Date 07/06/21      PT LONG TERM GOAL #7   Title Pt will report decreased diaper wear from 6 x day / night to < 4 x pads in order to improve continence    Time 8    Period Weeks    Status On-going    Target Date 08/17/21                   Plan - 07/28/21 1622     Clinical Impression Statement Pt required external manual Tx to address tight pelvic floor mm and required cues for proper quick pelvic floor coordination with less abdominal mm overuse. Anticipate today's improvements will help with urinary control;Marland KitchenSensation test intact bilaterally at low abdominal/ perineal area. Provided application of pelvic floor coordination with urination and completion of urination. Pt continues to benefit from skilled PT   Examination-Activity Limitations Continence;Toileting;Stand    Stability/Clinical Decision Making Evolving/Moderate complexity    Rehab Potential Good    PT Frequency 1x / week  PT Duration Other (comment)   10   PT Treatment/Interventions ADLs/Self Care Home Management;Moist Heat;Traction;Functional mobility  training;Therapeutic activities;Therapeutic exercise;Neuromuscular re-education;Balance training;Manual techniques;Patient/family education;Taping;Splinting;Energy conservation;Scar mobilization;Joint Manipulations;Biofeedback;Cryotherapy;Gait training;Stair training    Consulted and Agree with Plan of Care Patient             Patient will benefit from skilled therapeutic intervention in order to improve the following deficits and impairments:  Decreased activity tolerance, Decreased balance, Difficulty walking, Decreased endurance, Cardiopulmonary status limiting activity, Decreased coordination, Abnormal gait, Increased muscle spasms, Decreased scar mobility, Decreased strength, Decreased range of motion, Hypomobility, Improper body mechanics, Pain, Postural dysfunction, Decreased safety awareness, Decreased mobility  Visit Diagnosis: Other lack of coordination  Other abnormalities of gait and mobility  Abnormal posture     Problem List Patient Active Problem List   Diagnosis Date Noted   Fatigue 06/24/2021   Snoring 06/24/2021   Right hip pain 10/21/2020   Knee pain, bilateral 01/03/2017   Lower urinary tract symptoms (LUTS) 05/22/2014   HLD (hyperlipidemia) 02/05/2014   MORBID OBESITY 08/11/2010   BPH (benign prostatic hyperplasia) 08/11/2010    Jerl Mina, PT 07/28/2021, 6:19 PM  [place] MAIN [facility] SERVICES [address] [place], [place], [address] Phone: [phone]   Fax:  [phone]  Name: Riley Peterson MRN: [id number] Date of Birth: 09-17-1955

## 2021-08-03 ENCOUNTER — Ambulatory Visit: Payer: BC Managed Care – PPO | Admitting: Physical Therapy

## 2021-08-06 ENCOUNTER — Ambulatory Visit: Payer: BC Managed Care – PPO | Admitting: Physical Therapy

## 2021-08-06 ENCOUNTER — Other Ambulatory Visit: Payer: Self-pay

## 2021-08-06 DIAGNOSIS — R293 Abnormal posture: Secondary | ICD-10-CM

## 2021-08-06 DIAGNOSIS — R278 Other lack of coordination: Secondary | ICD-10-CM

## 2021-08-06 DIAGNOSIS — R2689 Other abnormalities of gait and mobility: Secondary | ICD-10-CM

## 2021-08-06 NOTE — Therapy (Signed)
[place] MAIN [facility] SERVICES [address] [place], [place], [address] Phone: [phone]   Fax:  [phone]  Physical Therapy Treatment  Patient Details  Name: Riley Peterson MRN: [id number] Date of Birth: 14-May-1956 Referring Provider (PT): Erlene Quan   Encounter Date: 08/06/2021   PT End of Session - 08/06/21 1124     Visit Number 6    Number of Visits 10    Date for PT Re-Evaluation 08/31/21   eval 06/22/21   PT Start Time 1104    PT Stop Time 1200    PT Time Calculation (min) 56 min    Activity Tolerance Patient tolerated treatment well;No increased pain    Behavior During Therapy WFL for tasks assessed/performed             Past Medical History:  Diagnosis Date   Benign prostatic hyperplasia with incomplete bladder emptying    BPH (benign prostatic hyperplasia)    Elevated PSA    Hyperlipidemia    Obesity    compulsive overeater    Past Surgical History:  Procedure Laterality Date   CHOLECYSTECTOMY     COLONOSCOPY WITH PROPOFOL N/A 01/07/2021   Procedure: COLONOSCOPY WITH PROPOFOL;  Surgeon: Lin Landsman, MD;  Location: [facility] ENDOSCOPY;  Service: Gastroenterology;  Laterality: N/A;   HOLEP-LASER ENUCLEATION OF THE PROSTATE WITH MORCELLATION N/A 02/16/2021   Procedure: HOLEP-LASER ENUCLEATION OF THE PROSTATE WITH MORCELLATION;  Surgeon: Hollice Espy, MD;  Location: [facility] ORS;  Service: Urology;  Laterality: N/A;   KIDNEY STONE SURGERY     PROSTATE BIOPSY  12/2020   negative   TONSILLECTOMY     VASECTOMY      There were no vitals filed for this visit.   Subjective Assessment - 08/06/21 1112     Subjective R hip pain sparactically occurs. Depending on which bed he sleeps in , pt gets discomfort in his back. Pt had callous on his feet and hecut them off and that has helped with his feet discomfort. Pt started his eating plan to lose weight. Pt is drinking more water like he used to. Pt noticed full force stream  repeatedly. That occurred only the first day of his plan. Pt is on his 4th day.    Patient Stated Goals to not leak                [facility] PT Assessment - 08/06/21 1410       Coordination   Coordination and Movement Description no cues required for deep core                           [facility] Adult PT Treatment/Exercise - 08/06/21 1411       Therapeutic Activites    Other Therapeutic Activities explained the importance of screening for OSA ,      Neuro Re-ed    Neuro Re-ed Details  cued for long holds contraction, seated quick , less posterior mm activation                          PT Long Term Goals - 07/28/21 1818       PT LONG TERM GOAL #1   Title Pt will demo  proper deep core coordination without chest breathing and optimal excursion of diaphragm/pelvic floor    Time 4    Period Weeks    Status On-going    Target Date 07/20/21  PT LONG TERM GOAL #2   Title Pt will demo IND with pelvic floor exercises in supine and seated positions to minimize risk for urinary incontinence    Time 6    Period Weeks    Status On-going    Target Date 08/03/21      PT LONG TERM GOAL #3   Title Pt will demo proper alignment and technique with sitting, logrolling, sit to stand, lifting, body mechanics with ADLs to minimize straining of abdominopelvic floor mm and spine.    Time 2    Period Weeks    Status Achieved    Target Date 07/06/21      PT LONG TERM GOAL #4   Title Pt will improve FOTO Urinary Problem 40 pt , PFDI 29 pts score by 5 change in order to improve QOL    Time 10    Period Weeks    Status On-going    Target Date 08/31/21      PT LONG TERM GOAL #5   Title Pt will report walking dog without LBP across 3 days in order to improve postural stability and improve QOL    Time 10    Period Weeks    Status On-going    Target Date 08/31/21      Additional Long Term Goals   Additional Long Term Goals Yes      PT LONG TERM GOAL #6    Title Pt will communicate with PCP to get sleep study to address nocturia    Time 2    Period Weeks    Status On-going    Target Date 07/06/21      PT LONG TERM GOAL #7   Title Pt will report decreased diaper wear from 6 x day / night to < 4 x pads in order to improve continence    Time 8    Period Weeks    Status On-going    Target Date 08/17/21                   Plan - 08/06/21 1125     Clinical Impression Statement Pt progressed to endurance pelvic floor contractions today. Pt no longer showed tightness of pelvic floor. Pt also advanced to seated pelvic floor contractions but required cues for less posterior activation of pelvic floor. Pt continues to benefit from skilled PT. Plan to add stretches and fitness education as pt is on a weight loss program and potential structural changes need to be accounted for as it relates to his pelvic and spinal and lower kinetic chain alignment. Pt also has R hip pain and this was the shorter leg. Pt is still wearing shoe lift in R shoe which is maintaining equal alignment of pelvic girdle and will help yield greater outcomes with pelvic floor training. Plan to address R hip pain, suspect more needs to be addressed given it was his shorter leg.  Pt continues to  benefit from skilled PT   Examination-Activity Limitations Continence;Toileting;Stand    Stability/Clinical Decision Making Evolving/Moderate complexity    Rehab Potential Good    PT Frequency 1x / week    PT Duration Other (comment)   10   PT Treatment/Interventions ADLs/Self Care Home Management;Moist Heat;Traction;Functional mobility training;Therapeutic activities;Therapeutic exercise;Neuromuscular re-education;Balance training;Manual techniques;Patient/family education;Taping;Splinting;Energy conservation;Scar mobilization;Joint Manipulations;Biofeedback;Cryotherapy;Gait training;Stair training    Consulted and Agree with Plan of Care Patient             Patient will  benefit from skilled therapeutic intervention in  order to improve the following deficits and impairments:  Decreased activity tolerance, Decreased balance, Difficulty walking, Decreased endurance, Cardiopulmonary status limiting activity, Decreased coordination, Abnormal gait, Increased muscle spasms, Decreased scar mobility, Decreased strength, Decreased range of motion, Hypomobility, Improper body mechanics, Pain, Postural dysfunction, Decreased safety awareness, Decreased mobility  Visit Diagnosis: Other lack of coordination  Other abnormalities of gait and mobility  Abnormal posture     Problem List Patient Active Problem List   Diagnosis Date Noted   Fatigue 06/24/2021   Snoring 06/24/2021   Right hip pain 10/21/2020   Knee pain, bilateral 01/03/2017   Lower urinary tract symptoms (LUTS) 05/22/2014   HLD (hyperlipidemia) 02/05/2014   MORBID OBESITY 08/11/2010   BPH (benign prostatic hyperplasia) 08/11/2010    Jerl Mina, PT 08/06/2021, 5:52 PM  [place] MAIN [facility] SERVICES [address] [place], [place], [address] Phone: [phone]   Fax:  [phone]  Name: Riley Peterson MRN: [id number] Date of Birth: 04/12/1956

## 2021-08-06 NOTE — Patient Instructions (Addendum)
Follow up with sleep study   New Shoe: [employer] and  [employer]       ___     Wide toes in shoes,  And anchored in deep core level 1 and 2  suction cups                                        __  PELVIC FLOOR / KEGEL EXERCISES   Pelvic floor/ Kegel exercises are used to strengthen the muscles in the base of your pelvis that are responsible for supporting your pelvic organs and preventing urine/feces leakage. Based on your therapists recommendations, they can be performed while standing, sitting, or lying down.  Make yourself aware of this muscle group by using these cues: Imagine you are in a crowded room and you feel the need to pass gas. Your response is to pull up and in at the rectum. Close the rectum. Pull the muscles up inside your body,feeling your scrotum lifting as well . Feel the pelvic floor muscles lift as if you were walking into a cold lake. Place your hand on top of your pubic bone. Tighten and draw in the muscles around the anal muscles without squeezing the buttock muscles.  Common Errors: Breath holding: If you are holding your breath, you may be bearing down against your bladder instead of pulling it up. If you belly bulges up while you are squeezing, you are holding your breath. Be sure to breathe gently in and out while exercising. Counting out loud may help you avoid holding your breath. Accessory muscle use: You should not see or feel other muscle movement when performing pelvic floor exercises. When done properly, no one can tell that you are performing the exercises. Keep the buttocks, belly and inner thighs relaxed. Overdoing it: Your muscles can fatigue and stop working for you if you over-exercise. You may actually leak more or feel soreness at the lower abdomen or rectum.  YOUR HOME EXERCISE PROGRAM  LONG HOLDS: Position: on back Inhale and then exhale. Then squeeze the muscle and count aloud for 3 seconds. Rest with three long breaths.  (Be sure to let belly sink in with exhales and not push outward) Perform _5_  repetitions, __4_  different times/day  (before deep core level 1 and after level 2 in the am and pm)   SHORT HOLDS: Position: on sitting  Inhale and then exhale. Then squeeze the muscle.  (Be sure to let belly sink in with exhales and not push outward) Perform 5 repetitions, 5  Times/day ( use meal time while sitting on a chair as 3 of the 5 )                                                      **ALSO SQUEEZE BEFORE YOUR SNEEZE, COUGH, LAUGH to decrease downward pressure   **ALSO EXHALE BEFORE YOU RISE AGAINST GRAVITY (lifting, sit to stand, from squat to stand)

## 2021-08-26 ENCOUNTER — Other Ambulatory Visit: Payer: Self-pay

## 2021-08-26 ENCOUNTER — Ambulatory Visit: Payer: BC Managed Care – PPO | Attending: Urology | Admitting: Physical Therapy

## 2021-08-26 DIAGNOSIS — R278 Other lack of coordination: Secondary | ICD-10-CM | POA: Insufficient documentation

## 2021-08-26 DIAGNOSIS — M533 Sacrococcygeal disorders, not elsewhere classified: Secondary | ICD-10-CM | POA: Diagnosis not present

## 2021-08-26 DIAGNOSIS — R2689 Other abnormalities of gait and mobility: Secondary | ICD-10-CM | POA: Insufficient documentation

## 2021-08-26 DIAGNOSIS — R293 Abnormal posture: Secondary | ICD-10-CM | POA: Diagnosis present

## 2021-08-27 NOTE — Therapy (Signed)
[place] MAIN [facility] SERVICES [address] [place], [place], [address] Phone: [phone]   Fax:  [phone]  Physical Therapy Treatment  Patient Details  Name: Riley Peterson MRN: [id number] Date of Birth: 1956/06/28 Referring Provider (PT): Erlene Quan   Encounter Date: 08/26/2021   PT End of Session - 08/26/21 1319     Visit Number 7    Number of Visits 10    Date for PT Re-Evaluation 08/31/21   eval 06/22/21   PT Start Time 1102    PT Stop Time 1200    PT Time Calculation (min) 58 min    Activity Tolerance Patient tolerated treatment well;No increased pain    Behavior During Therapy WFL for tasks assessed/performed             Past Medical History:  Diagnosis Date   Benign prostatic hyperplasia with incomplete bladder emptying    BPH (benign prostatic hyperplasia)    Elevated PSA    Hyperlipidemia    Obesity    compulsive overeater    Past Surgical History:  Procedure Laterality Date   CHOLECYSTECTOMY     COLONOSCOPY WITH PROPOFOL N/A 01/07/2021   Procedure: COLONOSCOPY WITH PROPOFOL;  Surgeon: Lin Landsman, MD;  Location: [facility] ENDOSCOPY;  Service: Gastroenterology;  Laterality: N/A;   HOLEP-LASER ENUCLEATION OF THE PROSTATE WITH MORCELLATION N/A 02/16/2021   Procedure: HOLEP-LASER ENUCLEATION OF THE PROSTATE WITH MORCELLATION;  Surgeon: Hollice Espy, MD;  Location: [facility] ORS;  Service: Urology;  Laterality: N/A;   KIDNEY STONE SURGERY     PROSTATE BIOPSY  12/2020   negative   TONSILLECTOMY     VASECTOMY      There were no vitals filed for this visit.   Subjective Assessment - 08/26/21 1111     Subjective Pt reported he bought new shoes. Pt is still losing weight with his diet plan. R hip pain is occasionally painful. It is on the groin. Pt is keeping up with pelvic floor squeezes in the office. Pt is able to finish urination with less dribbles. Pt is still leaking.  Pt went on his water drinking during the day.  Pt feels it is getting better with leakage at night.    Patient Stated Goals to not leak                [facility] PT Assessment - 08/27/21 0828       Ambulation/Gait   Pre-Gait Activities decreased hip flex/knee knee, heel striking ( improved post Training)      6 minute walk test results    Aerobic Endurance Distance Walked 1420    Endurance additional comments with cues on improved gait                        Pelvic Floor Special Questions - 08/27/21 0843     External Perineal Exam seated: 3 sec, 2 reps for long holds, 5 quick contractions               [facility] Adult PT Treatment/Exercise - 08/27/21 8185       Ambulation/Gait   Gait Comments training with resistance band to promote more anterior COM      Therapeutic Activites    Other Therapeutic Activities provided new shoe lift into R new shoe, explained walking 6 min with stretches, explained new walking mechanics to promote co-activation of deep core      Neuro Re-ed    Neuro Re-ed Details  cued  for post walking stretches , walking mechanics with co-activation of deep core                          PT Long Term Goals - 08/26/21 1114       PT LONG TERM GOAL #1   Title Pt will demo  proper deep core coordination without chest breathing and optimal excursion of diaphragm/pelvic floor    Time 4    Period Weeks    Status Achieved    Target Date 07/20/21      PT LONG TERM GOAL #2   Title Pt will demo IND with pelvic floor exercises in supine and seated positions to minimize risk for urinary incontinence    Time 6    Period Weeks    Status Partially Met    Target Date 08/03/21      PT LONG TERM GOAL #3   Title Pt will demo proper alignment and technique with sitting, logrolling, sit to stand, lifting, body mechanics with ADLs to minimize straining of abdominopelvic floor mm and spine.    Time 2    Period Weeks    Status Achieved    Target Date 07/06/21      PT LONG TERM GOAL  #4   Title Pt will improve FOTO Urinary Problem 40 pt , PFDI 29 pts score by 5 change in order to improve QOL    Time 10    Period Weeks    Status On-going    Target Date 08/31/21      PT LONG TERM GOAL #5   Title Pt will report walking dog without LBP across 3 days in order to improve postural stability and improve QOL    Time 10    Period Weeks    Status On-going    Target Date 08/31/21      PT LONG TERM GOAL #6   Title Pt will communicate with PCP to get sleep study to address nocturia    Time 2    Period Weeks    Status On-going    Target Date 07/06/21      PT LONG TERM GOAL #7   Title Pt will report decreased diaper wear from 6 x day / night to < 4 x pads in order to improve continence    Time 8    Period Weeks    Status On-going    Target Date 08/17/21                   Plan - 08/26/21 1319     Clinical Impression Statement Pt is noticing less dribbling after urination which indicates improved control of pelvic floor mm. Pt remains compliance to HEP and advanced to seated pelvic floor HEP today,  with less cues required. Added 6 MWT into HEP with stretches to minimize tightness of pelvic floor and lower kinetic chain. Pt required cues for proper co-activation of deep core to promote longer stride, stronger push off which will help with his R hip pain. New shoe lift was provided in the R shoe of the new pair of shoes that he bought . Shoes had wide toe box as recommended. Pt was explained that upcoming sessions will focus on strategies for gradual return to fitness in ways that will continue to help with incontinence. Pt continues to benefit  from skilled PT.   Examination-Activity Limitations Continence;Toileting;Stand    Stability/Clinical Decision Making Evolving/Moderate complexity    Rehab  Potential Good    PT Frequency 1x / week    PT Duration Other (comment)   10   PT Treatment/Interventions ADLs/Self Care Home Management;Moist Heat;Traction;Functional  mobility training;Therapeutic activities;Therapeutic exercise;Neuromuscular re-education;Balance training;Manual techniques;Patient/family education;Taping;Splinting;Energy conservation;Scar mobilization;Joint Manipulations;Biofeedback;Cryotherapy;Gait training;Stair training    Consulted and Agree with Plan of Care Patient             Patient will benefit from skilled therapeutic intervention in order to improve the following deficits and impairments:  Decreased activity tolerance, Decreased balance, Difficulty walking, Decreased endurance, Cardiopulmonary status limiting activity, Decreased coordination, Abnormal gait, Increased muscle spasms, Decreased scar mobility, Decreased strength, Decreased range of motion, Hypomobility, Improper body mechanics, Pain, Postural dysfunction, Decreased safety awareness, Decreased mobility  Visit Diagnosis: Other lack of coordination  Other abnormalities of gait and mobility  Abnormal posture     Problem List Patient Active Problem List   Diagnosis Date Noted   Fatigue 06/24/2021   Snoring 06/24/2021   Right hip pain 10/21/2020   Knee pain, bilateral 01/03/2017   Lower urinary tract symptoms (LUTS) 05/22/2014   HLD (hyperlipidemia) 02/05/2014   MORBID OBESITY 08/11/2010   BPH (benign prostatic hyperplasia) 08/11/2010    Jerl Mina, PT 08/27/2021, 8:45 AM  [place] MAIN [facility] SERVICES [address] [place], [place], [address] Phone: [phone]   Fax:  [phone]  Name: Riley Peterson MRN: [id number] Date of Birth: [birth year]

## 2021-08-27 NOTE — Patient Instructions (Signed)
6 min walking x 2 x day with post walking stretches:  __  Seated contractions:  3 sec, 3 reps  8am, 12noon, 4pm 5 quick 10am, 2pm, 6pm   Continue with contractions when lying down doing deep core

## 2021-09-01 ENCOUNTER — Ambulatory Visit: Payer: BC Managed Care – PPO | Admitting: Physical Therapy

## 2021-09-01 ENCOUNTER — Other Ambulatory Visit: Payer: Self-pay

## 2021-09-01 DIAGNOSIS — R278 Other lack of coordination: Secondary | ICD-10-CM | POA: Diagnosis not present

## 2021-09-01 DIAGNOSIS — R293 Abnormal posture: Secondary | ICD-10-CM

## 2021-09-01 DIAGNOSIS — R2689 Other abnormalities of gait and mobility: Secondary | ICD-10-CM

## 2021-09-01 NOTE — Addendum Note (Signed)
Addended by: Jerl Mina on: 09/01/2021 05:02 PM   Modules accepted: Orders

## 2021-09-01 NOTE — Therapy (Addendum)
[place] MAIN [facility] SERVICES [address] [place], [place], [address] Phone: [phone]   Fax:  [phone]  Physical Therapy Treatment  Patient Details  Name: Riley Peterson MRN: [id number] Date of Birth: 16-Oct-1955 Referring Provider (PT): Erlene Quan   Encounter Date: 09/01/2021   PT End of Session - 09/01/21 1406     Visit Number 8    Number of Visits 10    Date for PT Re-Evaluation 11/10/21   eval 06/22/21   PT Start Time 1829    PT Stop Time 1500    PT Time Calculation (min) 55 min    Activity Tolerance Patient tolerated treatment well;No increased pain    Behavior During Therapy WFL for tasks assessed/performed             Past Medical History:  Diagnosis Date   Benign prostatic hyperplasia with incomplete bladder emptying    BPH (benign prostatic hyperplasia)    Elevated PSA    Hyperlipidemia    Obesity    compulsive overeater    Past Surgical History:  Procedure Laterality Date   CHOLECYSTECTOMY     COLONOSCOPY WITH PROPOFOL N/A 01/07/2021   Procedure: COLONOSCOPY WITH PROPOFOL;  Surgeon: Lin Landsman, MD;  Location: [facility];  Service: Gastroenterology;  Laterality: N/A;   HOLEP-LASER ENUCLEATION OF THE PROSTATE WITH MORCELLATION N/A 02/16/2021   Procedure: HOLEP-LASER ENUCLEATION OF THE PROSTATE WITH MORCELLATION;  Surgeon: Hollice Espy, MD;  Location: [facility] ORS;  Service: Urology;  Laterality: N/A;   KIDNEY STONE SURGERY     PROSTATE BIOPSY  12/2020   negative   TONSILLECTOMY     VASECTOMY      There were no vitals filed for this visit.   Subjective Assessment - 09/01/21 1656     Subjective Pt has noticed improvement with less trips to the bathroom at night. Pt would like to review stratches from last week    Patient Stated Goals to not leak                [facility] PT Assessment - 09/01/21 1504       Observation/Other Assessments   Observations moving with stretches , not breathing ,  reported no lekaage noted when breathing instead of movement during stretches      Ambulation/Gait   Gait Comments good carry over with longer strides, more anterior COM                           [facility] Adult PT Treatment/Exercise - 09/01/21 1503       Therapeutic Activites    Other Therapeutic Activities administered FOTO, reassessed goals, discssued his fitness goals, encouraged compliance with pelvic floor HEP      Neuro Re-ed    Neuro Re-ed Details  cued for post walking stretches , cued for breathing during stretches not moving to minimize leakage                          PT Long Term Goals - 09/01/21 1407       PT LONG TERM GOAL #1   Title Pt will demo  proper deep core coordination without chest breathing and optimal excursion of diaphragm/pelvic floor    Time 4    Period Weeks    Status Achieved    Target Date 07/20/21      PT LONG TERM GOAL #2   Title Pt will demo IND  with pelvic floor exercises in supine and seated positions to minimize risk for urinary incontinence    Time 6    Period Weeks    Status Achieved    Target Date 08/03/21      PT LONG TERM GOAL #3   Title Pt will demo proper alignment and technique with sitting, logrolling, sit to stand, lifting, body mechanics with ADLs to minimize straining of abdominopelvic floor mm and spine.    Time 2    Period Weeks    Status Achieved    Target Date 07/06/21      PT LONG TERM GOAL #4   Title Pt will improve FOTO Urinary Problem 40 pt , PFDI 29 pts score by 5 change in order to improve QOL  ( 09/01/21: PFDI 12 pt change)    Time 10    Period Weeks    Status On-going    Target Date 08/31/21      PT LONG TERM GOAL #5   Title Pt will report walking dog without LBP across 3 days in order to improve postural stability and improve QOL    Time 10    Period Weeks    Status Achieved    Target Date 08/31/21      PT LONG TERM GOAL #6   Title Pt will communicate with PCP to get sleep  study to address nocturia    Time 2    Period Weeks    Status Achieved    Target Date 07/06/21      PT LONG TERM GOAL #7   Title Pt will report decreased diaper wear from 6 x day / night to < 4 x pads in order to improve continence ( 09/01/21: 4 diapers once this past week)    Time 8    Period Weeks    Status Partially Met    Target Date 08/17/21      PT LONG TERM GOAL #8   Title Pt will demo IND with integration with fitness routine with flexiblity routine and proper mechanics with resistance bands and biking    Time 10    Period Weeks    Status New    Target Date 11/10/21                   Plan - 09/01/21 1455     Clinical Impression Statement Pt has achieved 6/8 goals and progressing well towards remaining goals. Pt demo no more uneven pelvic girdle with shoe lift correcting leg length difference. Spinal alignment and upright posture has improved. Pt is IND with deep core and pelvic floor program. Plan to progress his HEP to help pt integrative back to walking and bicycling. Pt continues to benefit from skilled PT.     Examination-Activity Limitations Continence;Toileting;Stand    Stability/Clinical Decision Making Evolving/Moderate complexity    Rehab Potential Good    PT Frequency 1x / week    PT Duration Other (comment)   10   PT Treatment/Interventions ADLs/Self Care Home Management;Moist Heat;Traction;Functional mobility training;Therapeutic activities;Therapeutic exercise;Neuromuscular re-education;Balance training;Manual techniques;Patient/family education;Taping;Splinting;Energy conservation;Scar mobilization;Joint Manipulations;Biofeedback;Cryotherapy;Gait training;Stair training    Consulted and Agree with Plan of Care Patient             Patient will benefit from skilled therapeutic intervention in order to improve the following deficits and impairments:  Decreased activity tolerance, Decreased balance, Difficulty walking, Decreased endurance,  Cardiopulmonary status limiting activity, Decreased coordination, Abnormal gait, Increased muscle spasms, Decreased scar mobility, Decreased strength, Decreased range of  motion, Hypomobility, Improper body mechanics, Pain, Postural dysfunction, Decreased safety awareness, Decreased mobility  Visit Diagnosis: Other lack of coordination  Other abnormalities of gait and mobility  Abnormal posture     Problem List Patient Active Problem List   Diagnosis Date Noted   Fatigue 06/24/2021   Snoring 06/24/2021   Right hip pain 10/21/2020   Knee pain, bilateral 01/03/2017   Lower urinary tract symptoms (LUTS) 05/22/2014   HLD (hyperlipidemia) 02/05/2014   MORBID OBESITY 08/11/2010   BPH (benign prostatic hyperplasia) 08/11/2010    Jerl Mina, PT 09/01/2021, 4:57 PM  [place] MAIN [facility] SERVICES [address] [place], [place], [address] Phone: [phone]   Fax:  [phone]  Name: Covey Baller MRN: [id number] Date of Birth: [birth year]

## 2021-09-08 ENCOUNTER — Encounter: Payer: BC Managed Care – PPO | Admitting: Physical Therapy

## 2021-09-15 ENCOUNTER — Other Ambulatory Visit: Payer: Self-pay

## 2021-09-15 ENCOUNTER — Ambulatory Visit: Payer: BC Managed Care – PPO | Admitting: Physical Therapy

## 2021-09-15 DIAGNOSIS — R278 Other lack of coordination: Secondary | ICD-10-CM | POA: Diagnosis not present

## 2021-09-15 DIAGNOSIS — R293 Abnormal posture: Secondary | ICD-10-CM

## 2021-09-15 DIAGNOSIS — R2689 Other abnormalities of gait and mobility: Secondary | ICD-10-CM

## 2021-09-15 NOTE — Therapy (Signed)
[place] MAIN [facility] SERVICES [address] [place], [place], [address] Phone: [phone]   Fax:  [phone]  Physical Therapy Treatment  Patient Details  Name: Riley Peterson MRN: [id number] Date of Birth: 1956/07/10 Referring Provider (PT): Erlene Quan   Encounter Date: 09/15/2021   PT End of Session - 09/15/21 1535     Visit Number 9    Number of Visits 10    Date for PT Re-Evaluation 11/10/21   eval 06/22/21   PT Start Time 1410    PT Stop Time 1515    PT Time Calculation (min) 65 min    Activity Tolerance Patient tolerated treatment well;No increased pain    Behavior During Therapy WFL for tasks assessed/performed             Past Medical History:  Diagnosis Date   Benign prostatic hyperplasia with incomplete bladder emptying    BPH (benign prostatic hyperplasia)    Elevated PSA    Hyperlipidemia    Obesity    compulsive overeater    Past Surgical History:  Procedure Laterality Date   CHOLECYSTECTOMY     COLONOSCOPY WITH PROPOFOL N/A 01/07/2021   Procedure: COLONOSCOPY WITH PROPOFOL;  Surgeon: Lin Landsman, MD;  Location: [facility] ENDOSCOPY;  Service: Gastroenterology;  Laterality: N/A;   HOLEP-LASER ENUCLEATION OF THE PROSTATE WITH MORCELLATION N/A 02/16/2021   Procedure: HOLEP-LASER ENUCLEATION OF THE PROSTATE WITH MORCELLATION;  Surgeon: Hollice Espy, MD;  Location: [facility] ORS;  Service: Urology;  Laterality: N/A;   KIDNEY STONE SURGERY     PROSTATE BIOPSY  12/2020   negative   TONSILLECTOMY     VASECTOMY      There were no vitals filed for this visit.   Subjective Assessment - 09/15/21 1411     Subjective Pt has noticed improvement with less trips to the bathroom at night. Pt would like to review stratches from last week    Patient Stated Goals to not leak                [facility] PT Assessment - 09/15/21 1545       Coordination   Coordination and Movement Description poor prioception of feet in  backward lunges , coordination with arms with no diffiiculty, poor diassociation with multidifis, required downgrade to seated position instead of standing.                           [facility] PT Treatment/Exercise - 09/15/21 1537       Therapeutic Activites    Other Therapeutic Activities motivational interviewing for fitness goals, past fitness failed processes, explained depe core principles for preserving pelvic health when integrating back into fitness with weight lifting and principles of exercise      Neuro Re-ed    Neuro Re-ed Details  cued for multifidis twist w/ diassociation of trunk and pelvis ,propioception of foot in backward stepping/ lunge                          PT Long Term Goals - 09/01/21 1407       PT LONG TERM GOAL #1   Title Pt will demo  proper deep core coordination without chest breathing and optimal excursion of diaphragm/pelvic floor    Time 4    Period Weeks    Status Achieved    Target Date 07/20/21      PT LONG TERM GOAL #2  Title Pt will demo IND with pelvic floor exercises in supine and seated positions to minimize risk for urinary incontinence    Time 6    Period Weeks    Status Achieved    Target Date 08/03/21      PT LONG TERM GOAL #3   Title Pt will demo proper alignment and technique with sitting, logrolling, sit to stand, lifting, body mechanics with ADLs to minimize straining of abdominopelvic floor mm and spine.    Time 2    Period Weeks    Status Achieved    Target Date 07/06/21      PT LONG TERM GOAL #4   Title Pt will improve FOTO Urinary Problem 40 pt , PFDI 29 pts score by 5 change in order to improve QOL    Time 10    Period Weeks    Status On-going    Target Date 08/31/21      PT LONG TERM GOAL #5   Title Pt will report walking dog without LBP across 3 days in order to improve postural stability and improve QOL    Time 10    Period Weeks    Status Achieved    Target Date 08/31/21       PT LONG TERM GOAL #6   Title Pt will communicate with PCP to get sleep study to address nocturia    Time 2    Period Weeks    Status Achieved    Target Date 07/06/21      PT LONG TERM GOAL #7   Title Pt will report decreased diaper wear from 6 x day / night to < 4 x pads in order to improve continence ( 09/01/21: 4 diapers once this past week)    Time 8    Period Weeks    Status Partially Met    Target Date 08/17/21      PT LONG TERM GOAL #8   Title Pt will demo IND with integration with fitness routine with flexiblity routine and proper mechanics with resistance bands and biking    Time 10    Period Weeks    Status New    Target Date 11/10/21                   Plan - 09/15/21 1542     Clinical Impression Statement Progressed pt to strengthening multidifis mm to continue with more stability as pt continues to loss weight. Pt requried downgrade from standing to sitting position with exercise due to poor diassociation between trunk and pelvis. Provided motivational interviewing for fitness goals, past fitness failed processes and explained deep core principles for preserving pelvic health when integrating back into fitness with weight lifting and principles of exercise.  Plan to add glut strengthening, hip flexor stretches in to HEP to compliment his return to biking goal.  Pt reports less leakage at night and nocturia. Advised compliance to endurance strengthening of pelvic floor in anti-gravity position ( hooklying) to improve SUI. Pt continues to benefit from skilled PT    Examination-Activity Limitations Continence;Toileting;Stand    Stability/Clinical Decision Making Evolving/Moderate complexity    Rehab Potential Good    PT Frequency 1x / week    PT Duration Other (comment)   10   PT Treatment/Interventions ADLs/Self Care Home Management;Moist Heat;Traction;Functional mobility training;Therapeutic activities;Therapeutic exercise;Neuromuscular re-education;Balance  training;Manual techniques;Patient/family education;Taping;Splinting;Energy conservation;Scar mobilization;Joint Manipulations;Biofeedback;Cryotherapy;Gait training;Stair training    Consulted and Agree with Plan of Care Patient  Patient will benefit from skilled therapeutic intervention in order to improve the following deficits and impairments:  Decreased activity tolerance, Decreased balance, Difficulty walking, Decreased endurance, Cardiopulmonary status limiting activity, Decreased coordination, Abnormal gait, Increased muscle spasms, Decreased scar mobility, Decreased strength, Decreased range of motion, Hypomobility, Improper body mechanics, Pain, Postural dysfunction, Decreased safety awareness, Decreased mobility  Visit Diagnosis: Other lack of coordination  Other abnormalities of gait and mobility  Abnormal posture     Problem List Patient Active Problem List   Diagnosis Date Noted   Fatigue 06/24/2021   Snoring 06/24/2021   Right hip pain 10/21/2020   Knee pain, bilateral 01/03/2017   Lower urinary tract symptoms (LUTS) 05/22/2014   HLD (hyperlipidemia) 02/05/2014   MORBID OBESITY 08/11/2010   BPH (benign prostatic hyperplasia) 08/11/2010    Jerl Mina, PT 09/15/2021, 3:47 PM  [place] MAIN [facility] SERVICES [address] [place], [place], [address] Phone: [phone]   Fax:  [phone]  Name: Osmel Dykstra MRN: [id number] Date of Birth: February 01, 1956

## 2021-09-15 NOTE — Patient Instructions (Signed)
2 min  Stepback lungs, opp arm  __  Lat pull down with  "W" arms that do not move Mini squat with that arm  15 reps   ___  Multifidis twist  Band is on doorknob: seated  further away from door (facing perpendicular)   Twisting trunk without moving the hips and knees Hold band at the level of ribcage, elbows bent,shoulder blades roll back and down like squeezing a pencil under armpit   Exhale twist,.10-15 deg away from door without moving your hips/ knees, press more weight on the side of the sitting bones/ foot opp of your direction of turn as your counterweight. Continue to maintain equal weight through legs.  Keep knee unlocked.  30 reps

## 2021-09-23 ENCOUNTER — Ambulatory Visit: Payer: BC Managed Care – PPO | Attending: Urology | Admitting: Physical Therapy

## 2021-09-23 DIAGNOSIS — R278 Other lack of coordination: Secondary | ICD-10-CM | POA: Insufficient documentation

## 2021-09-23 DIAGNOSIS — R293 Abnormal posture: Secondary | ICD-10-CM | POA: Insufficient documentation

## 2021-09-23 DIAGNOSIS — R2689 Other abnormalities of gait and mobility: Secondary | ICD-10-CM | POA: Insufficient documentation

## 2021-09-25 ENCOUNTER — Other Ambulatory Visit: Payer: BC Managed Care – PPO

## 2021-09-25 ENCOUNTER — Other Ambulatory Visit: Payer: Self-pay

## 2021-09-25 DIAGNOSIS — R972 Elevated prostate specific antigen [PSA]: Secondary | ICD-10-CM

## 2021-09-26 LAB — PSA: Prostate Specific Ag, Serum: 0.6 ng/mL (ref 0.0–4.0)

## 2021-09-28 ENCOUNTER — Other Ambulatory Visit: Payer: Self-pay

## 2021-09-28 ENCOUNTER — Ambulatory Visit: Payer: BC Managed Care – PPO | Admitting: Physical Therapy

## 2021-09-28 DIAGNOSIS — R278 Other lack of coordination: Secondary | ICD-10-CM | POA: Diagnosis not present

## 2021-09-28 DIAGNOSIS — R2689 Other abnormalities of gait and mobility: Secondary | ICD-10-CM

## 2021-09-28 DIAGNOSIS — R293 Abnormal posture: Secondary | ICD-10-CM

## 2021-09-28 NOTE — Progress Notes (Signed)
? ?09/29/21 ?12:19 PM  ? ?Riley Peterson ?August 11, 1955 ?[id number] ? ?Referring provider:  ?Lesleigh Noe, MD ?[place],  [place] [address] ?Chief Complaint  ?Patient presents with  ? Benign Prostatic Hypertrophy  ? ? ? ?HPI: ?Riley Peterson is a 66 y.o.male with a personal history of BPH, stress incontinence, and ED, who presents today for 6 months follow-up with PSA, IPSS, and PVR.  ? ?He is s/p HoLEP on 02/16/2021.  Surgical pathology revealed benign prostatic glandular and stromal hyperplasia, 87 g resection. ? ?He underwent prostate biopsy which was negative as well as significantly enlarged prostate, 214 cc by TRUS with a median lobe. ?  ?His most recent PSA was 0.6 on 09/25/2021. ? ?He has been working diligently with physical therapy.  He did receive a note yesterday indicating that he has made progress. ? ?He gets up about twice at night to void.  During the daytime, he has fairly significant incontinence and unable to really quantify how much.  Overall, however he seen an improvement in the degree of saturation as well as his nighttime leakage which is now minimal. ? ?He thinks today that his initial recovery was set back by the death of his mother for which he was required to become the primary caretaker immediately postop.  He was really depressed and gained weight which also compounded the situation. ? ?He has made a commitment over this year to exercise, lose weight, and focus on his health. ? ? ? ?PMH: ?Past Medical History:  ?Diagnosis Date  ? Benign prostatic hyperplasia with incomplete bladder emptying   ? BPH (benign prostatic hyperplasia)   ? Elevated PSA   ? Hyperlipidemia   ? Obesity   ? compulsive overeater  ? ? ?Surgical History: ?Past Surgical History:  ?Procedure Laterality Date  ? CHOLECYSTECTOMY    ? COLONOSCOPY WITH PROPOFOL N/A 01/07/2021  ? Procedure: COLONOSCOPY WITH PROPOFOL;  Surgeon: Lin Landsman, MD;  Location: [facility] ENDOSCOPY;  Service: Gastroenterology;   Laterality: N/A;  ? HOLEP-LASER ENUCLEATION OF THE PROSTATE WITH MORCELLATION N/A 02/16/2021  ? Procedure: HOLEP-LASER ENUCLEATION OF THE PROSTATE WITH MORCELLATION;  Surgeon: Hollice Espy, MD;  Location: [facility] ORS;  Service: Urology;  Laterality: N/A;  ? KIDNEY STONE SURGERY    ? PROSTATE BIOPSY  12/2020  ? negative  ? TONSILLECTOMY    ? VASECTOMY    ? ? ?Home Medications:  ?Allergies as of 09/29/2021   ? ?   Reactions  ? Penicillins Other (See Comments)  ? Childhood allergy  ? ?  ? ?  ?Medication List  ?  ? ?  ? Accurate as of September 29, 2021 12:19 PM. If you have any questions, ask your nurse or doctor.  ?  ?  ? ?  ? ?atorvastatin 10 MG tablet ?Commonly known as: LIPITOR ?Take 1 tablet (10 mg total) by mouth daily. ?What changed: when to take this ?  ?Gemtesa 75 MG Tabs ?Generic drug: Vibegron ?Take 75 mg by mouth daily. ?Started by: Hollice Espy, MD ?  ?sildenafil 20 MG tablet ?Commonly known as: REVATIO ?Take 3-5 tablets one hour prior to intercourse ?  ? ?  ? ? ?Allergies:  ?Allergies  ?Allergen Reactions  ? Penicillins Other (See Comments)  ?  Childhood allergy  ? ? ?Family History: ?Family History  ?Problem Relation Age of Onset  ? Stroke Mother 72  ? Transient ischemic attack Mother   ? Diabetes Mother   ? Stroke Father 30  ? Hypertension  Father   ? Pancreatic cancer Maternal Uncle 8  ? Rheum arthritis Paternal Aunt   ? Epilepsy Paternal Uncle   ? Throat cancer Paternal Uncle   ? Stroke Maternal Grandmother   ? Stroke Paternal Grandmother   ? ? ?Social History:  reports that he has never smoked. He has never used smokeless tobacco. He reports current alcohol use. He reports that he does not use drugs. ? ? ?Physical Exam: ?BP 129/79   Pulse 80   Ht '6\' 1"'$  (1.854 m)   Wt 288 lb (130.6 kg)   BMI 38.00 kg/m?   ?Constitutional:  Alert and oriented, No acute distress. ?HEENT: [place] AT, moist mucus membranes.  Trachea midline, no masses. ?Cardiovascular: No clubbing, cyanosis, or edema. ?Respiratory: Normal  respiratory effort, no increased work of breathing. ?Skin: No rashes, bruises or suspicious lesions. ?Neurologic: Grossly intact, no focal deficits, moving all 4 extremities. ?Psychiatric: Normal mood and affect. ? ?Pertinent Imaging: ?Results for orders placed or performed in visit on 09/29/21  ?Bladder Scan (Post Void Residual) in office  ?Result Value Ref Range  ? Scan Result 0   ? ? ? ?Assessment & Plan:   ? ?1. Benign prostatic hyperplasia with urinary frequency ?Dramatic improvement obstructive urinary symptoms, now has excellent stream but continues to struggle with incontinence as below ? ?No longer any BPH medications ? ?Based on the conversation today, there may also be a small component of urgency and urge incontinence contributing to his symptoms.  I did go ahead and give him some samples of Gemtesa, 75 mg today for the next month to try to see if this helps any, he will let us know at the end of the month whether or not he that it was effective.  If not call we will defer treatment of this. ? ?New PSA baseline of 0.6 ? ?- Bladder Scan (Post Void Residual) in office ? ?2. Stress incontinence of urine ?Making improvement with physical therapy, commitment to weight loss and pelvic floor strength ? ?We did also discuss intermittent use of Wiesner clamp today and was provided information on this ? ?If incontinence continues to persist at very bothersome degree, could also consider referral to tertiary care center for consideration of AUS but given that he is improving with conservative management, will defer this for the time being ? ?Prefers to follow-up in a year with IPSS/PVR/PSA ? ?Conley Rolls as a Education administrator for Hollice Espy, MD.,have documented all relevant documentation on the behalf of Hollice Espy, MD,as directed by  Hollice Espy, MD while in the presence of Hollice Espy, MD. ? ?I have reviewed the above documentation for accuracy and completeness, and I agree with the above.   ? ?Hollice Espy, MD ? ?[facility] ?[address], [address] ?[place], [place] [address] ?[phone] ? ?

## 2021-09-28 NOTE — Patient Instructions (Addendum)
?  Exercise check off  DAILY  ? Mon Tue Wed Thurs Fri  Sat Sun  ?MORNING   ?        ?On your back  ?Before and after 6 min deep core level1-2                       ?_5_ sec hold,  ?2 rest breath,  ?_5_ reps  ?  ?        ? ?6 min deep core level1-2  ?  ?       ?  ? ?AFTER ?6 min deep core level1-2  ? ?_5_ sec hold,  ?2 rest breath,  ?_5_ reps  ?  ?       ?  ? ?Quick 5 REPS SEATED  ?10 AM  ?       ?  ? ?12PM  ?       ?  ? ? ?3PM   ?       ?  ? ?When seated, hands press by side on chair to squeeze shoulders back and down, chin tuck ?Rock pelvic forward slightly to find anterior tilt of pelvis for optimal lengthening of pelvic floor   ? ? Mon Tue Wed Thurs Fri  Sat Sun  ?NIGHTTIME   ?        ?On your back  ?Before 6 min deep core level1-2                       ?_5_ sec hold,  ?2 rest breath,  ?_5_ reps  ?  ?        ? ?6 min deep core level1-2   ?       ?  ? ?after 6 min deep core level1-2                       ?_5_ sec hold,  ?2 rest breath,  ?_5_ reps  ?  ?        ? ?___________________________________________________________________________________ ? ?AFTERNOON STRENGTHENING: to take a break from sitting  -15 min  ? ?-step back lunges with opp arm "V" ?2 mins during afternoon to take a break from sitting ? ? ?-lat pull down "w"  ?15 reps  ? ?- Multidifis twist  ?15  reps each side ?

## 2021-09-29 ENCOUNTER — Encounter: Payer: Self-pay | Admitting: Urology

## 2021-09-29 ENCOUNTER — Ambulatory Visit: Payer: BC Managed Care – PPO | Admitting: Urology

## 2021-09-29 VITALS — BP 129/79 | HR 80 | Ht 73.0 in | Wt 288.0 lb

## 2021-09-29 DIAGNOSIS — N401 Enlarged prostate with lower urinary tract symptoms: Secondary | ICD-10-CM | POA: Diagnosis not present

## 2021-09-29 DIAGNOSIS — N393 Stress incontinence (female) (male): Secondary | ICD-10-CM | POA: Diagnosis not present

## 2021-09-29 DIAGNOSIS — R350 Frequency of micturition: Secondary | ICD-10-CM | POA: Diagnosis not present

## 2021-09-29 LAB — BLADDER SCAN AMB NON-IMAGING: Scan Result: 0

## 2021-09-29 MED ORDER — GEMTESA 75 MG PO TABS: 75.0000 mg | ORAL_TABLET | Freq: Every day | ORAL | 0 refills | Status: DC

## 2021-09-29 NOTE — Therapy (Signed)
[place] [facility] ?[place], [place], [address] ?Phone: [phone]   Fax:  [phone] ? ?Physical Therapy Treatment / Progress NOte reporting from 06/22/21 to 09/28/21  ? ?Patient Details  ?Name: Riley Peterson ?MRN: [id number] ?Date of Birth: 28-Sep-1955 ?Referring Provider (PT): Erlene Quan ? ? ?Encounter Date: 09/28/2021 ? ? PT End of Session - 09/28/21 1436   ? ? Visit Number 10   ? Date for PT Re-Evaluation 11/10/21   eval 06/22/21, PN 09/28/21  ? PT Start Time 1408   ? PT Stop Time 1503   ? PT Time Calculation (min) 55 min   ? Activity Tolerance Patient tolerated treatment well;No increased pain   ? Behavior During Therapy [facility] for tasks assessed/performed   ? ?  ?  ? ?  ? ? ?Past Medical History:  ?Diagnosis Date  ? Benign prostatic hyperplasia with incomplete bladder emptying   ? BPH (benign prostatic hyperplasia)   ? Elevated PSA   ? Hyperlipidemia   ? Obesity   ? compulsive overeater  ? ? ?Past Surgical History:  ?Procedure Laterality Date  ? CHOLECYSTECTOMY    ? COLONOSCOPY WITH PROPOFOL N/A 01/07/2021  ? Procedure: COLONOSCOPY WITH PROPOFOL;  Surgeon: Lin Landsman, MD;  Location: [facility] ENDOSCOPY;  Service: Gastroenterology;  Laterality: N/A;  ? HOLEP-LASER ENUCLEATION OF THE PROSTATE WITH MORCELLATION N/A 02/16/2021  ? Procedure: HOLEP-LASER ENUCLEATION OF THE PROSTATE WITH MORCELLATION;  Surgeon: Hollice Espy, MD;  Location: [facility] ORS;  Service: Urology;  Laterality: N/A;  ? KIDNEY STONE SURGERY    ? PROSTATE BIOPSY  12/2020  ? negative  ? TONSILLECTOMY    ? VASECTOMY    ? ? ?There were no vitals filed for this visit. ? ? Subjective Assessment - 09/28/21 1410   ? ? Subjective Pt reports R hip pain is not a problem. Pt wants to make sure he is doing his exericses correctly.   ? Patient Stated Goals to not leak   ? ?  ?  ? ?  ? ? ? ? ? [facility] PT Assessment - 09/29/21 0804   ? ?  ? Observation/Other Assessments  ? Observations upright posture   ?  ?  Coordination  ? Coordination and Movement Description poor activation of scapulothoracic mm in past HEP   ? ?  ?  ? ?  ? ? ? ? ? ? ? ? ? ? ? ? ? ? ? ? [facility] Adult PT Treatment/Exercise - 09/29/21 0804   ? ?  ? Therapeutic Activites   ? Other Therapeutic Activities reassessed goals, administered FOTO, explained routine  and simplified pelvic floor enduration/ quick contractions and deep core and stretches   ?  ? Neuro Re-ed   ? Neuro Re-ed Details  cued for alignment and propioception in past HEP   ? ?  ?  ? ?  ? ? ? ? ? ? ? ? ? ? ? ? ? ? ? PT Long Term Goals - 09/29/21 0811   ? ?  ? PT LONG TERM GOAL #1  ? Title Pt will demo  proper deep core coordination without chest breathing and optimal excursion of diaphragm/pelvic floor   ? Time 4   ? Period Weeks   ? Status Achieved   ? Target Date 07/20/21   ?  ? PT LONG TERM GOAL #2  ? Title Pt will demo IND with pelvic floor exercises in supine and seated positions to minimize risk for urinary incontinence   ?  Time 6   ? Period Weeks   ? Status Achieved   ? Target Date 08/03/21   ?  ? PT LONG TERM GOAL #3  ? Title Pt will demo proper alignment and technique with sitting, logrolling, sit to stand, lifting, body mechanics with ADLs to minimize straining of abdominopelvic floor mm and spine.   ? Time 2   ? Period Weeks   ? Status Achieved   ? Target Date 07/06/21   ?  ? PT LONG TERM GOAL #4  ? Title Pt will improve FOTO Urinary Problem 40 pt , PFDI 29 pts score by 5 change in order to improve QOL  ( 09/29/21: Urinary Problem 1pts, PFDI 16pts change)   ? Time 10   ? Period Weeks   ? Status Partially Met   ? Target Date 08/31/21   ?  ? PT LONG TERM GOAL #5  ? Title Pt will report walking dog without LBP across 3 days in order to improve postural stability and improve QOL   ? Time 10   ? Period Weeks   ? Status Achieved   ? Target Date 08/31/21   ?  ? PT LONG TERM GOAL #6  ? Title Pt will communicate with PCP to get sleep study to address nocturia   ? Time 2   ? Period Weeks   ?  Status Achieved   ? Target Date 07/06/21   ?  ? PT LONG TERM GOAL #7  ? Title Pt will report decreased diaper wear from 6 x day / night to < 4 x pads in order to improve continence ( 09/01/21: 4 diapers once this past week)   ? Time 8   ? Period Weeks   ? Status Partially Met   ? Target Date 08/17/21   ?  ? PT LONG TERM GOAL #8  ? Title Pt will demo IND with integration with fitness routine with flexiblity routine and proper mechanics with resistance bands and biking   ? Time 10   ? Period Weeks   ? Status On-going   ? Target Date 11/10/21   ? ?  ?  ? ?  ? ? ? ? ? ? ? ? Plan - 09/28/21 1437   ? ? Clinical Impression Statement Pt has achieved 5/8 goals and progressing well towards remaining goals. Pt has been compliant to his HEP and demonstrates gradually decrease use of diaper wear from 6 / day to experiencing 4 diapers for one time across last week.  ? ?Pt's deep core coordination, structural alignment, and gait mechanics have improved to promote IAP system to help with continence. Pt continues to lose weight with a weight loss program. Today, focused on structuring his HEP systematically to build compliance with kegel strengthening and resistance band strengthening. PLan to continue to assist pt with fitness activities per his request and interest. Advised pt to not advance towards fitness too quickly and the importance of building deep core and pelvic floor first. Pt voiced understanding ? ?Pt continues to  benefit from skilled PT to achieve remaining goals.   ? Examination-Activity Limitations Continence;Toileting;Stand   ? Stability/Clinical Decision Making Evolving/Moderate complexity   ? Rehab Potential Good   ? PT Frequency 1x / week   ? PT Duration Other (comment)   10  ? PT Treatment/Interventions ADLs/Self Care Home Management;Moist Heat;Traction;Functional mobility training;Therapeutic activities;Therapeutic exercise;Neuromuscular re-education;Balance training;Manual techniques;Patient/family  education;Taping;Splinting;Energy conservation;Scar mobilization;Joint Manipulations;Biofeedback;Cryotherapy;Gait training;Stair training   ? Consulted and Agree with Plan of  Care Patient   ? ?  ?  ? ?  ? ? ?Patient will benefit from skilled therapeutic intervention in order to improve the following deficits and impairments:  Decreased activity tolerance, Decreased balance, Difficulty walking, Decreased endurance, Cardiopulmonary status limiting activity, Decreased coordination, Abnormal gait, Increased muscle spasms, Decreased scar mobility, Decreased strength, Decreased range of motion, Hypomobility, Improper body mechanics, Pain, Postural dysfunction, Decreased safety awareness, Decreased mobility ? ?Visit Diagnosis: ?Other lack of coordination ? ?Other abnormalities of gait and mobility ? ?Abnormal posture ? ? ? ? ?Problem List ?Patient Active Problem List  ? Diagnosis Date Noted  ? Fatigue 06/24/2021  ? Snoring 06/24/2021  ? Right hip pain 10/21/2020  ? Knee pain, bilateral 01/03/2017  ? Lower urinary tract symptoms (LUTS) 05/22/2014  ? HLD (hyperlipidemia) 02/05/2014  ? MORBID OBESITY 08/11/2010  ? BPH (benign prostatic hyperplasia) 08/11/2010  ? ? ?Jerl Mina, PT ?09/29/2021, 8:13 AM ? ?[place] [facility] ?[place], [place], [address] ?Phone: [phone]   Fax:  [phone] ? ?Name: Riley Peterson ?MRN: [id number] ?Date of Birth: 1956-03-12 ? ? ? ?

## 2021-09-29 NOTE — Patient Instructions (Addendum)
    [email or web address]  The Wiesner Incontinence Clamp is an external medical device used to control urine leakage by compressing the urethra and preventing the flow of urine   Lets you maintain your active lifestyle Cost effective, saving you lots of money on adult incontinence briefs Can be worn during any activity Ergonomic design promotes confidence and provides all-day comfort    Step 3 Latch the incontinence clamp to compress the  urethra at the level that's comfortable to you         Step 2 Place your penis between the silicone rubber pads with the  incontinence clamp about halfway down the shaft of your penis Step 1 Open the incontinence clamp by  releasing the catch and lifting up the top part  

## 2021-10-14 ENCOUNTER — Encounter: Payer: Self-pay | Admitting: Family Medicine

## 2021-10-14 ENCOUNTER — Telehealth (INDEPENDENT_AMBULATORY_CARE_PROVIDER_SITE_OTHER): Payer: BC Managed Care – PPO | Admitting: Family Medicine

## 2021-10-14 ENCOUNTER — Other Ambulatory Visit: Payer: Self-pay

## 2021-10-14 VITALS — Wt 280.0 lb

## 2021-10-14 DIAGNOSIS — U071 COVID-19: Secondary | ICD-10-CM | POA: Diagnosis not present

## 2021-10-14 MED ORDER — NIRMATRELVIR/RITONAVIR (PAXLOVID)TABLET: 3.0000 | ORAL_TABLET | Freq: Two times a day (BID) | ORAL | 0 refills | Status: AC

## 2021-10-14 NOTE — Progress Notes (Signed)
? ? ?  I connected with [name] on 10/14/21 at 12:20 PM EDT by video and verified that I am speaking with the correct person using two identifiers. ?  ?I discussed the limitations, risks, security and privacy concerns of performing an evaluation and management service by video and the availability of in person appointments. I also discussed with the patient that there may be a patient responsible charge related to this service. The patient expressed understanding and agreed to proceed. ? ?Patient location: Home ?Provider Location: [facility] ?Participants: Lesleigh Noe and [name] ? ? ?Subjective:  ? ?  ?Riley Peterson is a 65 y.o. male presenting for Covid Positive (Tested + today/Onset of symptoms Monday ), Sore Throat, and Nasal Congestion ?  ? ? ?Sore Throat  ? ? ?#Covid positive ?- 10/12/2021 symptom start ?- positive test today ?- stuffy nose ?- has been wearing a mask since Sunday  ?- wife got covid and they've been trying to separate  ?- no cough, sob ?- rare clearing the throat ? ?Review of Systems ? ? ?Social History  ? ?Tobacco Use  ?Smoking Status Never  ?Smokeless Tobacco Never  ? ? ? ?   ?Objective:  ? ?BP Readings from Last 3 Encounters:  ?09/29/21 129/79  ?06/24/21 122/78  ?03/31/21 (!) 145/87  ? ?Wt Readings from Last 3 Encounters:  ?10/14/21 280 lb (127 kg)  ?09/29/21 288 lb (130.6 kg)  ?06/24/21 (!) 306 lb (138.8 kg)  ? ? ?Wt 280 lb (127 kg)   BMI 36.94 kg/m?  ? ?Physical Exam ?Constitutional:   ?   Appearance: Normal appearance. He is not ill-appearing.  ?HENT:  ?   Head: Normocephalic and atraumatic.  ?   Right Ear: External ear normal.  ?   Left Ear: External ear normal.  ?Eyes:  ?   Conjunctiva/sclera: Conjunctivae normal.  ?Pulmonary:  ?   Effort: Pulmonary effort is normal. No respiratory distress.  ?Neurological:  ?   Mental Status: He is alert. Mental status is at baseline.  ?Psychiatric:     ?   Mood and Affect: Mood normal.     ?   Behavior: Behavior normal.     ?    Thought Content: Thought content normal.     ?   Judgment: Judgment normal.  ? ? ? ? ?   ?Assessment & Plan:  ? ?Problem List Items Addressed This Visit   ?None ?Visit Diagnoses   ? ? COVID-19 virus infection    -  Primary  ? ?  ? ?Patient is at increased risk for developing severe covid due to age and obesity. They are eligible for anti-viral medication.  ? ?Discussed Paxlovid and they would like to start. Normal liver and kidney function.  ? ?Lab Results  ?Component Value Date  ? ALT 21 07/02/2021  ? AST 19 07/02/2021  ? ALKPHOS 66 07/02/2021  ? BILITOT 0.6 07/02/2021  ? ? ?Lab Results  ?Component Value Date  ? CREATININE 0.83 07/02/2021  ? ?He will hold atorvastatin while taking and will not take viagra.  ? ?Medication sent to pharmacy ? ?Reviewed ER and return precautions ? ?Reviewed isolation guidelines.  ? ? ?Return if symptoms worsen or fail to improve. ? ?Lesleigh Noe, MD ? ?

## 2021-10-14 NOTE — Patient Instructions (Addendum)
Paxlovid - OK to start as late as 10/17/2021 ? ?Full 10-day isolation will end on 10/23/2021 - no mask required ?Can wear mask in public on 02/17/3663 ? ?Hold atorvastatin while taking the paxlovid and for 5 days afterwards.  ? ?Do not take viagra ? ? ?

## 2021-11-11 ENCOUNTER — Ambulatory Visit: Payer: BC Managed Care – PPO | Admitting: Physical Therapy

## 2021-11-25 ENCOUNTER — Encounter: Payer: BC Managed Care – PPO | Admitting: Physical Therapy

## 2021-12-09 ENCOUNTER — Encounter: Payer: BC Managed Care – PPO | Admitting: Physical Therapy

## 2022-03-10 ENCOUNTER — Ambulatory Visit: Payer: BC Managed Care – PPO | Admitting: Family Medicine

## 2022-03-10 VITALS — BP 130/80 | HR 78 | Temp 97.7°F | Ht 73.0 in | Wt 270.4 lb

## 2022-03-10 DIAGNOSIS — E785 Hyperlipidemia, unspecified: Secondary | ICD-10-CM | POA: Diagnosis not present

## 2022-03-10 DIAGNOSIS — L255 Unspecified contact dermatitis due to plants, except food: Secondary | ICD-10-CM

## 2022-03-10 MED ORDER — DOXYCYCLINE HYCLATE 100 MG PO TABS: 100.0000 mg | ORAL_TABLET | Freq: Two times a day (BID) | ORAL | 0 refills | Status: AC

## 2022-03-10 MED ORDER — ATORVASTATIN CALCIUM 10 MG PO TABS: 10.0000 mg | ORAL_TABLET | Freq: Every day | ORAL | 3 refills | Status: DC

## 2022-03-10 MED ORDER — PREDNISONE 20 MG PO TABS: ORAL_TABLET | ORAL | 0 refills | Status: AC

## 2022-03-10 NOTE — Patient Instructions (Signed)
Start Prednisone  If worsening redness - start doxycycline (sun sensitivity)   If redness improved no need for antibiotics

## 2022-03-10 NOTE — Progress Notes (Signed)
Subjective:     Riley Peterson is a 66 y.o. male presenting for Skin Problem (Red spots on both legs and rash on both arms x 4 days, after doing yard work. )     HPI  #Skin lesion - was pulling a fence line - has several lesions on the legs - not sure if he hit some veins - did do some searching for ticks - has several spots with black central lesion - was pulling out a lot of tree  - itchy lesions - has spots all over his arms and legs - did find a tick in the shower floor - all of this occurred on Saturday - did not remove any ticks from the body - had been doing tick checks  #hyperlipidemia - has not been taking his cholesterol medication - is willing to restart  Review of Systems  Constitutional:  Negative for chills and fever.  Gastrointestinal:  Negative for nausea and vomiting.  Musculoskeletal:  Negative for arthralgias and myalgias.     Social History   Tobacco Use  Smoking Status Never  Smokeless Tobacco Never        Objective:    BP Readings from Last 3 Encounters:  03/10/22 130/80  09/29/21 129/79  06/24/21 122/78   Wt Readings from Last 3 Encounters:  03/10/22 270 lb 6 oz (122.6 kg)  10/14/21 280 lb (127 kg)  09/29/21 288 lb (130.6 kg)    BP 130/80   Pulse 78   Temp 97.7 F (36.5 C) (Temporal)   Ht '6\' 1"'$  (1.854 m)   Wt 270 lb 6 oz (122.6 kg)   SpO2 98%   BMI 35.67 kg/m    Physical Exam Constitutional:      Appearance: Normal appearance. He is not ill-appearing or diaphoretic.  HENT:     Right Ear: External ear normal.     Left Ear: External ear normal.     Nose: Nose normal.  Eyes:     General: No scleral icterus.    Extraocular Movements: Extraocular movements intact.     Conjunctiva/sclera: Conjunctivae normal.  Cardiovascular:     Rate and Rhythm: Normal rate.  Pulmonary:     Effort: Pulmonary effort is normal.  Musculoskeletal:     Cervical back: Neck supple.     Comments: Bilateral Legs: dark erythematous  circular macules with central black scab in 4 separate locations.   Bilateral arms: streaking erythematous with papular/vesicular appearance  Face: erythematous papule over the right eyebrow  Skin:    General: Skin is warm and dry.  Neurological:     Mental Status: He is alert. Mental status is at baseline.  Psychiatric:        Mood and Affect: Mood normal.     The 10-year ASCVD risk score (Arnett DK, et al., 2019) is: 10.8%   Values used to calculate the score:     Age: 9 years     Sex: Male     Is Non-Hispanic African American: No     Diabetic: No     Tobacco smoker: No     Systolic Blood Pressure: 762 mmHg     Is BP treated: No     HDL Cholesterol: 40.6 mg/dL     Total Cholesterol: 160 mg/dL       Assessment & Plan:   Problem List Items Addressed This Visit       Other   HLD (hyperlipidemia)    ASCVD of 10%.  Discussed restarting atorvastatin  10 mg, sent to pharmacy.  Will return in 6 weeks for his annual and recheck of lipids.      Relevant Medications   atorvastatin (LIPITOR) 10 MG tablet   Other Visit Diagnoses     Contact dermatitis due to plant    -  Primary   Relevant Medications   doxycycline (VIBRA-TABS) 100 MG tablet   predniSONE (DELTASONE) 20 MG tablet      His arms are consistent with a contact dermatitis, legs are unclear if insect bites which may potentially be infected.  Discussed starting prednisone today, if redness improves no need for antibiotics, however if redness worsens to start the doxycycline and update if no improvement.  He was concerned about tick, did not find 1 on his body but saw one in his house.  At this time he is outside of the window for prophylaxis, but it sounds like nothing was attached for an extended period of time, no bull's-eye rash to suggest Lyme's.  We will treat with doxycycline to cover skin and possible tickborne illness if erythema does not improve after 24 hours of prednisone.  Return in about 6 weeks (around  04/21/2022) for CPE.  Lesleigh Noe, MD

## 2022-03-10 NOTE — Assessment & Plan Note (Signed)
ASCVD of 10%.  Discussed restarting atorvastatin 10 mg, sent to pharmacy.  Will return in 6 weeks for his annual and recheck of lipids.

## 2022-04-21 ENCOUNTER — Ambulatory Visit (INDEPENDENT_AMBULATORY_CARE_PROVIDER_SITE_OTHER): Payer: BC Managed Care – PPO | Admitting: Family Medicine

## 2022-04-21 VITALS — BP 120/70 | HR 79 | Temp 97.8°F | Ht 72.5 in | Wt 267.5 lb

## 2022-04-21 DIAGNOSIS — E785 Hyperlipidemia, unspecified: Secondary | ICD-10-CM

## 2022-04-21 DIAGNOSIS — Z23 Encounter for immunization: Secondary | ICD-10-CM | POA: Diagnosis not present

## 2022-04-21 DIAGNOSIS — Z0000 Encounter for general adult medical examination without abnormal findings: Secondary | ICD-10-CM

## 2022-04-21 DIAGNOSIS — N529 Male erectile dysfunction, unspecified: Secondary | ICD-10-CM | POA: Insufficient documentation

## 2022-04-21 DIAGNOSIS — N5239 Other post-surgical erectile dysfunction: Secondary | ICD-10-CM | POA: Diagnosis not present

## 2022-04-21 LAB — COMPREHENSIVE METABOLIC PANEL
ALT: 17 U/L (ref 0–53)
AST: 16 U/L (ref 0–37)
Albumin: 4.5 g/dL (ref 3.5–5.2)
Alkaline Phosphatase: 72 U/L (ref 39–117)
BUN: 15 mg/dL (ref 6–23)
CO2: 28 mEq/L (ref 19–32)
Calcium: 9.6 mg/dL (ref 8.4–10.5)
Chloride: 103 mEq/L (ref 96–112)
Creatinine, Ser: 0.96 mg/dL (ref 0.40–1.50)
GFR: 83.82 mL/min (ref >60.00)
Glucose, Bld: 97 mg/dL (ref 70–99)
Potassium: 4.9 mEq/L (ref 3.5–5.1)
Sodium: 137 mEq/L (ref 135–145)
Total Bilirubin: 0.9 mg/dL (ref 0.2–1.2)
Total Protein: 7.4 g/dL (ref 6.0–8.3)

## 2022-04-21 LAB — LIPID PANEL
Cholesterol: 127 mg/dL (ref 0–200)
HDL: 48 mg/dL (ref >39.00)
LDL Cholesterol: 65 mg/dL (ref 0–99)
NonHDL: 78.65
Total CHOL/HDL Ratio: 3
Triglycerides: 70 mg/dL (ref 0.0–149.0)
VLDL: 14 mg/dL (ref 0.0–40.0)

## 2022-04-21 NOTE — Progress Notes (Signed)
Annual Exam   Chief Complaint:  Chief Complaint  Patient presents with   Annual Exam    No concerns    History of Present Illness:  Riley Peterson is a 66 y.o. presents today for annual examination.     Nutrition/Lifestyle Diet: not as good after vacation Exercise: trying to get back on track - walking and biking He is single partner, contraception - post menopausal status.  Any issues with getting or keeping erection? Yes  Has urinary incontinence BPH Working to try and lose some weight Started pelvic PT in March w/ some improvement Wearing a diaper Has viagra at home  Social History   Tobacco Use  Smoking Status Never  Smokeless Tobacco Never   Social History   Substance and Sexual Activity  Alcohol Use Yes   Comment: rare, 1 glass of wine occasionally   Social History   Substance and Sexual Activity  Drug Use No     Safety The patient wears seatbelts: yes.     The patient feels safe at home and in their relationships: yes.  General Health Dentist in the last year: Yes Eye doctor: no  Weight Wt Readings from Last 3 Encounters:  04/21/22 267 lb 8 oz (121.3 kg)  03/10/22 270 lb 6 oz (122.6 kg)  10/14/21 280 lb (127 kg)   Patient has high BMI  BMI Readings from Last 1 Encounters:  04/21/22 35.78 kg/m     Chronic disease screening Blood pressure monitoring:  BP Readings from Last 3 Encounters:  04/21/22 120/70  03/10/22 130/80  09/29/21 129/79    Lipid Monitoring: Indication for screening: age >35, obesity, diabetes, family hx, CV risk factors.  Lipid screening: Yes  Lab Results  Component Value Date   CHOL 160 10/21/2020   HDL 40.60 10/21/2020   LDLCALC 102 (H) 10/21/2020   TRIG 89.0 10/21/2020   CHOLHDL 4 10/21/2020     Diabetes Screening: age >43, overweight, family hx, PCOS, hx of gestational diabetes, at risk ethnicity, elevated blood pressure >135/80.  Diabetes Screening screening: Yes  Lab Results  Component Value Date    HGBA1C 5.3 10/21/2020     Prostate Cancer Screening: Yes Age 6-69 yo Shared Decision Making Higher Risk: Older age, African American, Family Hx of Prostate Cancer - No Benefits: screening may prevent 1.3 deaths from prostate cancer over 13 years per 1000 men screened and prevent 3 metastatic cases per 1000 men screened. Not enough evidence to support more benefit for AA or [place] Harms: False Positive and psychological harms. 15% of me with false positive over a 2 to 4 year period > resulting in biopsy and complications such as pain, hematospermia, infections. Overdiagnosis - increases with age - found that 20-50% of prostate cancer through screening may have never caused any issues. Harms of treatment include - erectile dysfunction, urinary incontinence, and bothersome bowel symptoms.   After discussion he does want to get a PSA checked today.   Inadequate evidence for screening <55 No mortality benefit for screening >70   Lab Results  Component Value Date   PSA1 0.6 09/25/2021   PSA 4.16 (H) 10/21/2020   PSA 3.79 03/22/2018   PSA 2.90 11/24/2015       Colon Cancer Screening:  Age 25-75 yo - benefits outweigh the risk. Adults 35-85 yo who have never been screened benefit.  Benefits: 134000 people in 2016 will be diagnosed and 49,000 will die - early detection helps Harms: Complications 2/2 to colonoscopy High Risk (Colonoscopy): genetic disorder (  Lynch syndrome or familial adenomatous polyposis), personal hx of IBD, previous adenomatous polyp, or previous colorectal cancer, FamHx start 10 years before the age at diagnosis, increased in males and black race  Options:  FIT - looks for hemoglobin (blood in the stool) - specific and fairly sensitive - must be done annually Cologuard - looks for DNA and blood - more sensitive - therefore can have more false positives, every 3 years Colonoscopy - every 10 years if normal - sedation, bowl prep, must have someone drive you  Shared  decision making and the patient had decided to do Colonoscopy due 2027.   Social History   Tobacco Use  Smoking Status Never  Smokeless Tobacco Never    Lung Cancer Screening (Ages 05-39): not applicable       Past Medical History:  Diagnosis Date   Benign prostatic hyperplasia with incomplete bladder emptying    BPH (benign prostatic hyperplasia)    Elevated PSA    Hyperlipidemia    Obesity    compulsive overeater    Past Surgical History:  Procedure Laterality Date   CHOLECYSTECTOMY     COLONOSCOPY WITH PROPOFOL N/A 01/07/2021   Procedure: COLONOSCOPY WITH PROPOFOL;  Surgeon: Lin Landsman, MD;  Location: [facility] ENDOSCOPY;  Service: Gastroenterology;  Laterality: N/A;   HOLEP-LASER ENUCLEATION OF THE PROSTATE WITH MORCELLATION N/A 02/16/2021   Procedure: HOLEP-LASER ENUCLEATION OF THE PROSTATE WITH MORCELLATION;  Surgeon: Hollice Espy, MD;  Location: [facility] ORS;  Service: Urology;  Laterality: N/A;   KIDNEY STONE SURGERY     PROSTATE BIOPSY  12/2020   negative   TONSILLECTOMY     VASECTOMY      Prior to Admission medications   Medication Sig Start Date End Date Taking? Authorizing Provider  atorvastatin (LIPITOR) 10 MG tablet Take 1 tablet (10 mg total) by mouth daily. 03/10/22  Yes Lesleigh Noe, MD    Allergies  Allergen Reactions   Penicillins Other (See Comments)    Childhood allergy     Social History   Socioeconomic History   Marital status: Married    Spouse name: Rise Paganini   Number of children: 2   Years of education: masthers   Highest education level: Not on file  Occupational History   Occupation: Mining engineer: [employer]  Tobacco Use   Smoking status: Never   Smokeless tobacco: Never  Substance and Sexual Activity   Alcohol use: Yes    Comment: rare, 1 glass of wine occasionally   Drug use: No   Sexual activity: Not Currently    Birth control/protection: Surgical  Other Topics Concern   Not on file  Social  History Narrative   09/17/19   From: [place] originally, 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   Living: with wife Rise Paganini   Work: Theme park manager at [employer]      Family: adult children - Ria Comment and Thurmond Butts - 2 grandchildren (in [place])       Enjoys: bicycling and spending time with grandchildren      Exercise: has home equipment - doing some exercise on and off   Diet: when on the program - veggies/proteins - no flour/sugar      Safety   Seat belts: Yes    Guns: No   Safe in relationships: Yes    Social Determinants of Radio broadcast assistant Strain: Not on file  Food Insecurity: Not on file  Transportation Needs: Not on file  Physical Activity:  Not on file  Stress: Not on file  Social Connections: Not on file  Intimate Partner Violence: Not on file    Family History  Problem Relation Age of Onset   Stroke Mother 49   Transient ischemic attack Mother    Diabetes Mother    Stroke Father 46   Hypertension Father    Pancreatic cancer Maternal Uncle 36   Rheum arthritis Paternal Aunt    Epilepsy Paternal Uncle    Throat cancer Paternal Uncle    Stroke Maternal Grandmother    Stroke Paternal Grandmother     Review of Systems  Constitutional:  Negative for chills and fever.  HENT:  Negative for congestion and sore throat.   Eyes:  Negative for blurred vision and double vision.  Respiratory:  Negative for shortness of breath.   Cardiovascular:  Negative for chest pain.  Gastrointestinal:  Negative for heartburn, nausea and vomiting.  Genitourinary: Negative.   Musculoskeletal: Negative.  Negative for myalgias.  Skin:  Negative for rash.  Neurological:  Negative for dizziness and headaches.  Endo/Heme/Allergies:  Does not bruise/bleed easily.  Psychiatric/Behavioral:  Negative for depression. The patient is not nervous/anxious.      Physical Exam BP 120/70   Pulse 79   Temp 97.8 F (36.6 C) (Temporal)   Ht 6' 0.5" (1.842 m)   Wt 267 lb 8 oz (121.3 kg)    SpO2 98%   BMI 35.78 kg/m    BP Readings from Last 3 Encounters:  04/21/22 120/70  03/10/22 130/80  09/29/21 129/79      Physical Exam Constitutional:      General: He is not in acute distress.    Appearance: He is well-developed. He is not diaphoretic.  HENT:     Head: Normocephalic and atraumatic.     Right Ear: Tympanic membrane and ear canal normal.     Left Ear: Tympanic membrane and ear canal normal.     Nose: Nose normal.     Mouth/Throat:     Pharynx: Uvula midline.  Eyes:     General: No scleral icterus.    Conjunctiva/sclera: Conjunctivae normal.     Pupils: Pupils are equal, round, and reactive to light.  Cardiovascular:     Rate and Rhythm: Normal rate and regular rhythm.     Heart sounds: Normal heart sounds. No murmur heard. Pulmonary:     Effort: Pulmonary effort is normal. No respiratory distress.     Breath sounds: Normal breath sounds. No wheezing.  Abdominal:     General: Bowel sounds are normal. There is no distension.     Palpations: Abdomen is soft. There is no mass.     Tenderness: There is no abdominal tenderness. There is no guarding.  Musculoskeletal:        General: Normal range of motion.     Cervical back: Normal range of motion and neck supple.  Lymphadenopathy:     Cervical: No cervical adenopathy.  Skin:    General: Skin is warm and dry.     Capillary Refill: Capillary refill takes less than 2 seconds.  Neurological:     Mental Status: He is alert and oriented to person, place, and time.        Results:  PHQ-9:  [facility] Office Visit from 10/21/2020 in [place] [facility] [place]  PHQ-9 Total Score 5         Assessment: 66 y.o. here for routine annual physical examination.  Plan: Problem List Items Addressed This  Visit       Other   HLD (hyperlipidemia)   Relevant Orders   Comprehensive metabolic panel   Lipid panel   Erectile dysfunction    Associate with incontinence and s/p procedure. Discussed  ok to use viagra. Also discussed continue pelvic PT - previous was billed poorly will try to get private group.       Other Visit Diagnoses     Annual physical exam    -  Primary   Need for influenza vaccination       Relevant Orders   Flu Vaccine QUAD 74moIM (Fluarix, Fluzone & Alfiuria Quad PF) (Completed)   Need for Tdap vaccination       Relevant Orders   Tdap vaccine greater than or equal to 7yo IM (Completed)       Screening: -- Blood pressure screen normal -- cholesterol screening: will obtain -- Weight screening: obese: discussed management options, including lifestyle, dietary, and exercise. -- Diabetes Screening: will obtain -- Nutrition: normal - Encouraged healthy diet  The 10-year ASCVD risk score (Arnett DK, et al., 2019) is: 10.2%   Values used to calculate the score:     Age: [age]years     Sex: Male     Is Non-Hispanic African American: No     Diabetic: No     Tobacco smoker: No     Systolic Blood Pressure: 1563mmHg     Is BP treated: No     HDL Cholesterol: 40.6 mg/dL     Total Cholesterol: 160 mg/dL  -- ASA 81 mg discussed if CVD risk >10% age 64-59and willing to take for 10 years -- Statin therapy for Age 64-75with CVD risk >7.5%  Psych -- Depression screening (PHQ-9): negative  Safety -- tobacco screening: not using -- alcohol screening:  low-risk usage. -- no evidence of domestic violence or intimate partner violence.  Cancer Screening -- Prostate (age 64-69  up to date -- Colon (age 64-75  up to date -- Lung not indicated   Immunizations Immunization History  Administered Date(s) Administered   Influenza,inj,Quad PF,6+ Mos 06/23/2020, 04/21/2022   Influenza-Unspecified 05/11/2021   Moderna Covid-19 Vaccine Bivalent Booster 116yr& up 05/11/2021   Moderna SARS-COV2 Booster Vaccination 02/11/2021   PFIZER(Purple Top)SARS-COV-2 Vaccination 09/19/2019, 10/10/2019, 06/23/2020   Tdap 11/02/2011, 04/21/2022   Zoster Recombinat (Shingrix)  10/21/2020, 01/20/2021    -- flu vaccine up to date -- TDAP q10 years up to date -- Shingles (age >5>29up to date  -- Covid-19 Vaccine up to date  Encouraged regular vision and dental screening. Encouraged healthy exercise and diet.   JeLesleigh Noe

## 2022-04-21 NOTE — Assessment & Plan Note (Signed)
Associate with incontinence and s/p procedure. Discussed ok to use viagra. Also discussed continue pelvic PT - previous was billed poorly will try to get private group.

## 2022-04-21 NOTE — Patient Instructions (Addendum)
Pelvic PT  [email or web address]  -- [place]  [email or web address]  - [place]  OK to try Viagra

## 2022-10-05 ENCOUNTER — Ambulatory Visit: Payer: BC Managed Care – PPO | Admitting: Urology

## 2022-10-05 VITALS — BP 132/73 | HR 94 | Ht 73.0 in | Wt 270.4 lb

## 2022-10-05 DIAGNOSIS — N5239 Other post-surgical erectile dysfunction: Secondary | ICD-10-CM | POA: Diagnosis not present

## 2022-10-05 DIAGNOSIS — N401 Enlarged prostate with lower urinary tract symptoms: Secondary | ICD-10-CM

## 2022-10-05 DIAGNOSIS — N393 Stress incontinence (female) (male): Secondary | ICD-10-CM

## 2022-10-05 DIAGNOSIS — R972 Elevated prostate specific antigen [PSA]: Secondary | ICD-10-CM

## 2022-10-05 DIAGNOSIS — R350 Frequency of micturition: Secondary | ICD-10-CM

## 2022-10-05 LAB — BLADDER SCAN AMB NON-IMAGING: Scan Result: 15

## 2022-10-05 NOTE — Progress Notes (Signed)
I,Amy L Pierron,acting as a scribe for Hollice Espy, MD.,have documented all relevant documentation on the behalf of Hollice Espy, MD,as directed by  Hollice Espy, MD while in the presence of Hollice Espy, MD.  10/05/2022 2:17 PM   Riley Peterson [birth year] [id number]  Referring provider: Waunita Schooner, MD [address] [place],  [place] [address]  Chief Complaint  Patient presents with   Follow-up   Benign Prostatic Hypertrophy    HPI: 67 year-old male with a personal history of BPH returns today for a routine annual follow-up with IPSS and PVR.   He is status post HoLep in 01/2021. Surgical pathology was benign. He had an 87 gram resection. His pre-op TRUS volume was 214 with a large median lobe.  He hasn't had a PSA this year. His previous baseline on 09/25/2021 was 0.6 following prostatectomy.  He was having some issues with incontinence and worked with physical therapy.  He reports persistent incontinence, which worsens with increased fluid intake, and has not seen improvement despite previous efforts with physical therapy. He has also experienced weight gain, which he believes is contributing to his incontinence, and has struggled with weight loss efforts. He has noticed a change in his body's fat distribution, with increased abdominal fat, and has questioned the role of testosterone in his weight and incontinence issues. He has a history of using a clamp for incontinence, particularly in the summer for activities like swimming. He is interested in exploring the possibility of a referral for a more definitive treatment for his incontinence, such as a sling or sphincter.  He has also expressed concerns about erectile dysfunction and the effectiveness of treatments like sildenafil, which he reports did not work for him.   Results for orders placed or performed in visit on 10/05/22  BLADDER SCAN AMB NON-IMAGING  Result Value Ref Range   Scan Result 15 ml      IPSS      Row Name 10/05/22 1000         International Prostate Symptom Score   How often have you had the sensation of not emptying your bladder? Not at All     How often have you had to urinate less than every two hours? Less than 1 in 5 times     How often have you found you stopped and started again several times when you urinated? Not at All     How often have you found it difficult to postpone urination? Not at All     How often have you had a weak urinary stream? Not at All     How often have you had to strain to start urination? Not at All     How many times did you typically get up at night to urinate? 1 Time     Total IPSS Score 2       Quality of Life due to urinary symptoms   If you were to spend the rest of your life with your urinary condition just the way it is now how would you feel about that? Mixed            Score:  1-7 Mild 8-19 Moderate 20-35 Severe    PMH: Past Medical History:  Diagnosis Date   Benign prostatic hyperplasia with incomplete bladder emptying    BPH (benign prostatic hyperplasia)    Elevated PSA    Hyperlipidemia    Obesity    compulsive overeater    Surgical History: Past Surgical  History:  Procedure Laterality Date   CHOLECYSTECTOMY     COLONOSCOPY WITH PROPOFOL N/A 01/07/2021   Procedure: COLONOSCOPY WITH PROPOFOL;  Surgeon: Lin Landsman, MD;  Location: [facility] ENDOSCOPY;  Service: Gastroenterology;  Laterality: N/A;   HOLEP-LASER ENUCLEATION OF THE PROSTATE WITH MORCELLATION N/A 02/16/2021   Procedure: HOLEP-LASER ENUCLEATION OF THE PROSTATE WITH MORCELLATION;  Surgeon: Hollice Espy, MD;  Location: [facility] ORS;  Service: Urology;  Laterality: N/A;   KIDNEY STONE SURGERY     PROSTATE BIOPSY  12/2020   negative   TONSILLECTOMY     VASECTOMY      Home Medications:  Allergies as of 10/05/2022       Reactions   Penicillins Other (See Comments)   Childhood allergy        Medication List        Accurate as of October 05, 2022  2:17 PM. If you have any questions, ask your nurse or doctor.          atorvastatin 10 MG tablet Commonly known as: LIPITOR Take 1 tablet (10 mg total) by mouth daily.        Allergies:  Allergies  Allergen Reactions   Penicillins Other (See Comments)    Childhood allergy    Family History: Family History  Problem Relation Age of Onset   Stroke Mother 52   Transient ischemic attack Mother    Diabetes Mother    Stroke Father 78   Hypertension Father    Pancreatic cancer Maternal Uncle 33   Rheum arthritis Paternal Aunt    Epilepsy Paternal Uncle    Throat cancer Paternal Uncle    Stroke Maternal Grandmother    Stroke Paternal Grandmother     Social History:  reports that he has never smoked. He has never used smokeless tobacco. He reports current alcohol use. He reports that he does not use drugs.   Physical Exam: BP 132/73   Pulse 94   Ht '6\' 1"'$  (1.854 m)   Wt 270 lb 6 oz (122.6 kg)   BMI 35.67 kg/m   Constitutional:  Alert and oriented, No acute distress. HEENT: [place] AT, moist mucus membranes.  Trachea midline, no masses. Neurologic: Grossly intact, no focal deficits, moving all 4 extremities. Psychiatric: Normal mood and affect.   Assessment & Plan:    Stress incontinence  - Persistent and still bothersome.   - He is using a clamp. He is no longer able to see a physical therapist due to insurance network issues. He will let us know if he finds a new one covered on his plan.   - Continued to advocate for weight loss. Discussed whether or not to test for testosterone. He is not having any other symptoms of that.   - Discussed sphincter versus sling. He'll let us know if he'd like a referral to a tertiary care center for a high-volume implanter.    2.  BPH   - Continues to struggle with incontinence as indicated above s/p HoLEP for massive prostamegaly -PSA today -overall emptying is excellent and IPSS is also very good  3. Erectile Dysfunction  -  He reports sildenafil was not effective. He has noticed some improvement with nocturnal erections.  - No change in management. He is not interested in pursuing further treatment at this time.  Return in about 1 year (around 10/05/2023) for IPSS, PVR, PSA.  I have reviewed the above documentation for accuracy and completeness, and I agree with the above.   Caryl Pina  Erlene Quan, [place] [address], [place], [place] [address] [phone]  I spent 31 total minutes on the day of the encounter including pre-visit review of the medical record, face-to-face time with the patient, and post visit ordering of labs/imaging/tests.

## 2022-10-06 LAB — PSA: Prostate Specific Ag, Serum: 0.6 ng/mL (ref 0.0–4.0)

## 2023-02-04 ENCOUNTER — Ambulatory Visit: Payer: BC Managed Care – PPO | Admitting: Internal Medicine

## 2023-02-04 ENCOUNTER — Encounter: Payer: Self-pay | Admitting: Internal Medicine

## 2023-02-04 VITALS — BP 124/76 | HR 69 | Temp 97.9°F | Ht 73.0 in | Wt 285.0 lb

## 2023-02-04 DIAGNOSIS — H6123 Impacted cerumen, bilateral: Secondary | ICD-10-CM | POA: Diagnosis not present

## 2023-02-04 NOTE — Progress Notes (Signed)
Subjective:    Patient ID: Riley Peterson, male    DOB: [birth year], 67 y.o.   MRN: [id number]  HPI Here due to ear congestion  Right ear especially--just can't get it cleared out Some hearing loss due to this Goes back 2-3 weeks  Left ear also --but not as bad  Tried peroxide and flushing--not really helpful  Current Outpatient Medications on File Prior to Visit  Medication Sig Dispense Refill   atorvastatin (LIPITOR) 10 MG tablet Take 1 tablet (10 mg total) by mouth daily. 90 tablet 3   No current facility-administered medications on file prior to visit.    Allergies  Allergen Reactions   Penicillins Other (See Comments)    Childhood allergy    Past Medical History:  Diagnosis Date   Benign prostatic hyperplasia with incomplete bladder emptying    BPH (benign prostatic hyperplasia)    Elevated PSA    Hyperlipidemia    Obesity    compulsive overeater    Past Surgical History:  Procedure Laterality Date   CHOLECYSTECTOMY     COLONOSCOPY WITH PROPOFOL N/A 01/07/2021   Procedure: COLONOSCOPY WITH PROPOFOL;  Surgeon: Toney Reil, MD;  Location: [facility] ENDOSCOPY;  Service: Gastroenterology;  Laterality: N/A;   HOLEP-LASER ENUCLEATION OF THE PROSTATE WITH MORCELLATION N/A 02/16/2021   Procedure: HOLEP-LASER ENUCLEATION OF THE PROSTATE WITH MORCELLATION;  Surgeon: Vanna Scotland, MD;  Location: [facility] ORS;  Service: Urology;  Laterality: N/A;   KIDNEY STONE SURGERY     PROSTATE BIOPSY  12/2020   negative   TONSILLECTOMY     VASECTOMY      Family History  Problem Relation Age of Onset   Stroke Mother 77   Transient ischemic attack Mother    Diabetes Mother    Stroke Father 28   Hypertension Father    Pancreatic cancer Maternal Uncle 38   Rheum arthritis Paternal Aunt    Epilepsy Paternal Uncle    Throat cancer Paternal Uncle    Stroke Maternal Grandmother    Stroke Paternal Grandmother     Social History   Socioeconomic History   Marital status:  Married    Spouse name: Meriam Sprague   Number of children: 2   Years of education: Agricultural engineer   Highest education level: Not on file  Occupational History   Occupation: Engineer, materials: [employer]  Tobacco Use   Smoking status: Never   Smokeless tobacco: Never  Substance and Sexual Activity   Alcohol use: Yes    Comment: rare, 1 glass of wine occasionally   Drug use: No   Sexual activity: Not Currently    Birth control/protection: Surgical  Other Topics Concern   Not on file  Social History Narrative   09/17/19   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, 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   Living: with wife Meriam Sprague   Work: Education officer, environmental at [employer]      Family: adult children - Lillia Abed and Alycia Rossetti - 2 grandchildren (in [place])       Enjoys: bicycling and spending time with grandchildren      Exercise: has home equipment - doing some exercise on and off   Diet: when on the program - veggies/proteins - no flour/sugar      Safety   Seat belts: Yes    Guns: No   Safe in relationships: Yes    Social Determinants of Corporate investment banker Strain: Not on file  Food Insecurity: Not on file  Transportation Needs: Not on file  Physical Activity: Not on file  Stress: Not on file  Social Connections: Unknown (12/08/2021)   Received from [facility], [facility]   Social Network    Social Network: Not on file  Intimate Partner Violence: Unknown (10/30/2021)   Received from [facility], [facility]   HITS    Physically Hurt: Not on file    Insult or Talk Down To: Not on file    Threaten Physical Harm: Not on file    Scream or Curse: Not on file   Review of Systems Some tinnitus on right now No vertigo     Objective:   Physical Exam Constitutional:      Appearance: Normal appearance.  HENT:     Ears:     Comments: No tragal tenderness Both canals occluded with cerumen No apparent inflammation Neurological:     Mental Status: He is alert.             Assessment & Plan:

## 2023-02-04 NOTE — Assessment & Plan Note (Addendum)
Will flush out Discussed using peroxide solution regularly as preventative as well  Ears flushed and hearing better

## 2023-04-07 ENCOUNTER — Ambulatory Visit: Payer: Self-pay | Admitting: Internal Medicine

## 2023-04-07 ENCOUNTER — Encounter: Payer: Self-pay | Admitting: Internal Medicine

## 2023-04-07 VITALS — BP 120/80 | HR 75 | Temp 97.9°F | Ht 73.0 in | Wt 288.0 lb

## 2023-04-07 DIAGNOSIS — M25551 Pain in right hip: Secondary | ICD-10-CM
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
## 2023-04-07 NOTE — Assessment & Plan Note (Addendum)
Findings suggest significant OA Will send to Dr Magnus Ivan for further evaluation Discussed quad strengthening --especially if he needs surgery

## 2023-04-25 ENCOUNTER — Other Ambulatory Visit (INDEPENDENT_AMBULATORY_CARE_PROVIDER_SITE_OTHER): Payer: Medicare PPO

## 2023-04-25 ENCOUNTER — Encounter: Payer: Self-pay | Admitting: Orthopaedic Surgery

## 2023-04-25 ENCOUNTER — Ambulatory Visit (INDEPENDENT_AMBULATORY_CARE_PROVIDER_SITE_OTHER): Payer: Medicare PPO | Admitting: Orthopaedic Surgery

## 2023-04-25 VITALS — Ht 73.0 in | Wt 300.0 lb

## 2023-04-25 DIAGNOSIS — M1611 Unilateral primary osteoarthritis, right hip: Secondary | ICD-10-CM | POA: Diagnosis not present

## 2023-04-25 DIAGNOSIS — M25551 Pain in right hip: Secondary | ICD-10-CM

## 2023-04-25 NOTE — Progress Notes (Signed)
The patient is a very pleasant and active 67 year old gentleman who Dr. Tillman Abide has sent my way to evaluate right hip pain.  He actually injured this hip many years ago but is recently started developing worsening pain with groin pain as well.  He has gained weight and his BMI is 39.58.  He has dealt with urinary incontinence from previous prostate surgery.  He is trying to stay active and get active again.  I was able to review all of his medications and past medical history within epic.  He does not need to walk with assist device he states.  He is hoping to lose some more weight before considering any type of surgery.  Examination of his left hip is entirely normal with fluid and full range of motion of his left hip and no pain in the groin at all.  His right hip has some slight stiffness with rotation and there is definitely pain in the groin with internal and external rotation.  Standing AP pelvis and lateral of his right hip does show superior lateral joint space narrowing.  There are cystic changes in the femoral head and osteophytes around the lateral femoral head.  His left hip appears normal.  I did go over his x-ray findings and the difference between his right and left hip.  I gave him a handout about hip replacement surgery and we talked about this in length in detail.  I do think weight loss will help him as well as occasional anti-inflammatories.  We talked about the ability of an intra-articular steroid injection.  He would like to wait till least to get through the holidays so we both agreed to see him back in early January and look at his hip again.  Will get a repeat weight and BMI calculation of that standpoint and then go from there in terms of discussing hip replacement surgery again.

## 2023-06-17 DIAGNOSIS — L578 Other skin changes due to chronic exposure to nonionizing radiation: Secondary | ICD-10-CM | POA: Diagnosis not present

## 2023-06-17 DIAGNOSIS — Z86018 Personal history of other benign neoplasm: Secondary | ICD-10-CM | POA: Diagnosis not present

## 2023-07-13 ENCOUNTER — Encounter: Payer: Self-pay | Admitting: Internal Medicine

## 2023-07-13 ENCOUNTER — Ambulatory Visit: Payer: Medicare PPO | Admitting: Internal Medicine

## 2023-07-13 VITALS — BP 118/80 | HR 71 | Temp 98.6°F | Ht 72.25 in | Wt 280.0 lb

## 2023-07-13 DIAGNOSIS — Z0000 Encounter for general adult medical examination without abnormal findings: Secondary | ICD-10-CM | POA: Diagnosis not present

## 2023-07-13 DIAGNOSIS — M150 Primary generalized (osteo)arthritis: Secondary | ICD-10-CM

## 2023-07-13 DIAGNOSIS — E785 Hyperlipidemia, unspecified: Secondary | ICD-10-CM | POA: Diagnosis not present

## 2023-07-13 DIAGNOSIS — Z23 Encounter for immunization: Secondary | ICD-10-CM

## 2023-07-13 DIAGNOSIS — N401 Enlarged prostate with lower urinary tract symptoms: Secondary | ICD-10-CM

## 2023-07-13 LAB — LIPID PANEL
Cholesterol: 138 mg/dL (ref 0–200)
HDL: 45.7 mg/dL (ref >39.00)
LDL Cholesterol: 77 mg/dL (ref 0–99)
NonHDL: 92.4
Total CHOL/HDL Ratio: 3
Triglycerides: 76 mg/dL (ref 0.0–149.0)
VLDL: 15.2 mg/dL (ref 0.0–40.0)

## 2023-07-13 LAB — CBC
HCT: 41.2 % (ref 39.0–52.0)
Hemoglobin: 14 g/dL (ref 13.0–17.0)
MCHC: 33.9 g/dL (ref 30.0–36.0)
MCV: 87.1 fL (ref 78.0–100.0)
Platelets: 254 10*3/uL (ref 150.0–400.0)
RBC: 4.73 Mil/uL (ref 4.22–5.81)
RDW: 13.7 % (ref 11.5–15.5)
WBC: 4.4 10*3/uL (ref 4.0–10.5)

## 2023-07-13 LAB — COMPREHENSIVE METABOLIC PANEL
ALT: 17 U/L (ref 0–53)
AST: 16 U/L (ref 0–37)
Albumin: 4.2 g/dL (ref 3.5–5.2)
Alkaline Phosphatase: 73 U/L (ref 39–117)
BUN: 16 mg/dL (ref 6–23)
CO2: 29 meq/L (ref 19–32)
Calcium: 8.9 mg/dL (ref 8.4–10.5)
Chloride: 108 meq/L (ref 96–112)
Creatinine, Ser: 0.76 mg/dL (ref 0.40–1.50)
GFR: 94.5 mL/min (ref >60.00)
Glucose, Bld: 109 mg/dL — ABNORMAL HIGH (ref 70–99)
Potassium: 4.8 meq/L (ref 3.5–5.1)
Sodium: 141 meq/L (ref 135–145)
Total Bilirubin: 0.7 mg/dL (ref 0.2–1.2)
Total Protein: 6.4 g/dL (ref 6.0–8.3)

## 2023-07-13 MED ORDER — ATORVASTATIN CALCIUM 10 MG PO TABS: 10.0000 mg | ORAL_TABLET | Freq: Every day | ORAL | 3 refills | Status: AC

## 2023-07-13 NOTE — Assessment & Plan Note (Signed)
No problems with atorvastatin 10mg  for primary prevention

## 2023-07-13 NOTE — Assessment & Plan Note (Signed)
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

## 2023-07-13 NOTE — Progress Notes (Signed)
Subjective:    Patient ID: Riley Peterson, male    DOB: [birth year], 67 y.o.   MRN: [id number]  HPI Here for Welcome to Medicare visit and follow up of chronic health conditions Reviewed advanced directives Reviewed other doctors---Dr Lanny Cramp, Dr Charyl Dancer, Dr Benjamin Stain, Dr Rober Minion No hospitalizations or surgery in the past year Has been trying to exercise--gets cramping easily Vision is fine--overdue for eye exam Hearing is fine No alcohol or tobacco No falls Non depression or anhedonia Independent with instrumental ADLs No memory problems--other than names  Did see ortho Probably needs TKR--but working on losing weight before he does that Has changed diet and has lost some weight BMI still 37 Still some pain--especially on stairs going down  Ongoing incontinence Tends to limit water due to this---and may predispose to cramps  Continues on statin---no problems with this  Current Outpatient Medications on File Prior to Visit  Medication Sig Dispense Refill   atorvastatin (LIPITOR) 10 MG tablet Take 1 tablet (10 mg total) by mouth daily. 90 tablet 3   No current facility-administered medications on file prior to visit.    Allergies  Allergen Reactions   Penicillins Other (See Comments)    Childhood allergy    Past Medical History:  Diagnosis Date   Benign prostatic hyperplasia with incomplete bladder emptying    BPH (benign prostatic hyperplasia)    Elevated PSA    Hyperlipidemia    Obesity    compulsive overeater    Past Surgical History:  Procedure Laterality Date   CHOLECYSTECTOMY     COLONOSCOPY WITH PROPOFOL N/A 01/07/2021   Procedure: COLONOSCOPY WITH PROPOFOL;  Surgeon: Toney Reil, MD;  Location: [facility] ENDOSCOPY;  Service: Gastroenterology;  Laterality: N/A;   HOLEP-LASER ENUCLEATION OF THE PROSTATE WITH MORCELLATION N/A 02/16/2021   Procedure: HOLEP-LASER ENUCLEATION OF THE PROSTATE WITH MORCELLATION;  Surgeon: Vanna Scotland, MD;  Location: [facility] ORS;  Service: Urology;  Laterality: N/A;   KIDNEY STONE SURGERY     PROSTATE BIOPSY  12/2020   negative   TONSILLECTOMY     VASECTOMY      Family History  Problem Relation Age of Onset   Stroke Mother 78   Transient ischemic attack Mother    Diabetes Mother    Stroke Father 10   Hypertension Father    Pancreatic cancer Maternal Uncle 1   Rheum arthritis Paternal Aunt    Epilepsy Paternal Uncle    Throat cancer Paternal Uncle    Stroke Maternal Grandmother    Stroke Paternal Grandmother     Social History   Socioeconomic History   Marital status: Married    Spouse name: Meriam Sprague   Number of children: 2   Years of education: Agricultural engineer   Highest education level: Not on file  Occupational History   Occupation: Engineer, materials: [employer]  Tobacco Use   Smoking status: Never   Smokeless tobacco: Never  Substance and Sexual Activity   Alcohol use: Yes    Comment: rare, 1 glass of wine occasionally   Drug use: No   Sexual activity: Not Currently    Birth control/protection: Surgical  Other Topics Concern   Not on file  Social History Narrative   09/17/19   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, 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   Living: with wife Meriam Sprague   Work: Education officer, environmental at [employer]      Family: adult children - Lillia Abed and Alycia Rossetti - 2 grandchildren (in [place])  Enjoys: bicycling and spending time with grandchildren       Safety   Seat belts: Yes    Guns: No   Safe in relationships: Yes       No living will   Wife would be health care POA   Would accept resuscitation and tube feedings at this point   Social Drivers of Health   Financial Resource Strain: Not on file  Food Insecurity: Not on file  Transportation Needs: Not on file  Physical Activity: Not on file  Stress: Not on file  Social Connections: Unknown (12/08/2021)   Received from [facility], [facility]   Social Network    Social Network: Not  on file  Intimate Partner Violence: Unknown (10/30/2021)   Received from [facility], [facility]   HITS    Physically Hurt: Not on file    Insult or Talk Down To: Not on file    Threaten Physical Harm: Not on file    Scream or Curse: Not on file   Review of Systems Appetite is okay Initiates sleep well--some night awakening (mind keeps going) No apnea noted by wife--just snores Wears seat belt Teeth are okay--keeps up with dentist Did have lesion removed--but not cancer. Yearly derm visits Does have some back pain--mostly just the hip though No chest pain or SOB No dizziness or syncope No edema Bowels move okay--no blood    Objective:   Physical Exam Constitutional:      Appearance: Normal appearance. He is obese.  HENT:     Mouth/Throat:     Pharynx: No oropharyngeal exudate or posterior oropharyngeal erythema.  Eyes:     Conjunctiva/sclera: Conjunctivae normal.     Pupils: Pupils are equal, round, and reactive to light.  Cardiovascular:     Rate and Rhythm: Normal rate and regular rhythm.     Pulses: Normal pulses.     Heart sounds: No murmur heard.    No gallop.  Pulmonary:     Effort: Pulmonary effort is normal.     Breath sounds: Normal breath sounds. No wheezing or rales.  Abdominal:     Palpations: Abdomen is soft.     Tenderness: There is no abdominal tenderness.  Musculoskeletal:     Cervical back: Neck supple.     Right lower leg: No edema.     Left lower leg: No edema.  Lymphadenopathy:     Cervical: No cervical adenopathy.  Skin:    Findings: No lesion or rash.  Neurological:     General: No focal deficit present.     Mental Status: He is alert and oriented to person, place, and time.     Comments: Word naming---12/1 minute Recall 3/3  Psychiatric:        Mood and Affect: Mood normal.        Behavior: Behavior normal.            Assessment & Plan:

## 2023-07-13 NOTE — Assessment & Plan Note (Signed)
Now with incontinence after prostate procedure

## 2023-07-13 NOTE — Progress Notes (Signed)
Hearing Screening  Method: Audiometry   500Hz  1000Hz  2000Hz  4000Hz   Right ear 20 20 20 20   Left ear 20 20 20 20    Vision Screening   Right eye Left eye Both eyes  Without correction 20/20 20/13 20/13   With correction

## 2023-07-13 NOTE — Assessment & Plan Note (Signed)
I have personally reviewed the Medicare Annual Wellness questionnaire and have noted 1. The patient's medical and social history 2. Their use of alcohol, tobacco or illicit drugs 3. Their current medications and supplements 4. The patient's functional ability including ADL's, fall risks, home safety risks and hearing or visual             impairment. 5. Diet and physical activities 6. Evidence for depression or mood disorders  The patients weight, height, BMI and visual acuity have been recorded in the chart I have made referrals, counseling and provided education to the patient based review of the above and I have provided the pt with a written personalized care plan for preventive services.  I have provided you with a copy of your personalized plan for preventive services. Please take the time to review along with your updated medication list.  EKG on file Is starting to do more exercise Colon due 2027 PSA per urologist Had flu/COVID vaccines Prevnar 20 today

## 2023-07-13 NOTE — Assessment & Plan Note (Signed)
BMI 37 with arthritis, hyperlipidemia Is working on fitness

## 2023-07-13 NOTE — Addendum Note (Signed)
Addended by: Eual Fines on: 07/13/2023 12:37 PM   Modules accepted: Orders

## 2023-08-31 ENCOUNTER — Ambulatory Visit: Payer: Medicare PPO | Admitting: Orthopaedic Surgery

## 2023-09-30 ENCOUNTER — Other Ambulatory Visit: Payer: BC Managed Care – PPO

## 2023-09-30 DIAGNOSIS — N401 Enlarged prostate with lower urinary tract symptoms: Secondary | ICD-10-CM | POA: Diagnosis not present

## 2023-09-30 DIAGNOSIS — R350 Frequency of micturition: Secondary | ICD-10-CM | POA: Diagnosis not present

## 2023-09-30 DIAGNOSIS — R972 Elevated prostate specific antigen [PSA]: Secondary | ICD-10-CM | POA: Diagnosis not present

## 2023-10-01 LAB — PSA: Prostate Specific Ag, Serum: 0.7 ng/mL (ref 0.0–4.0)

## 2023-10-05 ENCOUNTER — Ambulatory Visit: Payer: BC Managed Care – PPO | Admitting: Urology

## 2023-10-05 VITALS — BP 158/83 | HR 79

## 2023-10-05 DIAGNOSIS — N401 Enlarged prostate with lower urinary tract symptoms: Secondary | ICD-10-CM

## 2023-10-05 DIAGNOSIS — N393 Stress incontinence (female) (male): Secondary | ICD-10-CM | POA: Diagnosis not present

## 2023-10-05 DIAGNOSIS — N5239 Other post-surgical erectile dysfunction: Secondary | ICD-10-CM | POA: Diagnosis not present

## 2023-10-05 DIAGNOSIS — R350 Frequency of micturition: Secondary | ICD-10-CM | POA: Diagnosis not present

## 2023-10-05 LAB — BLADDER SCAN AMB NON-IMAGING: Scan Result: 20
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
## 2023-11-01 DIAGNOSIS — M1611 Unilateral primary osteoarthritis, right hip: Secondary | ICD-10-CM | POA: Diagnosis not present

## 2023-11-01 DIAGNOSIS — M25551 Pain in right hip: Secondary | ICD-10-CM | POA: Diagnosis not present

## 2023-11-01 DIAGNOSIS — R262 Difficulty in walking, not elsewhere classified: Secondary | ICD-10-CM | POA: Diagnosis not present

## 2023-11-10 DIAGNOSIS — R262 Difficulty in walking, not elsewhere classified: Secondary | ICD-10-CM | POA: Diagnosis not present

## 2023-11-10 DIAGNOSIS — M1611 Unilateral primary osteoarthritis, right hip: Secondary | ICD-10-CM | POA: Diagnosis not present

## 2023-11-10 DIAGNOSIS — M25551 Pain in right hip: Secondary | ICD-10-CM | POA: Diagnosis not present

## 2023-11-17 DIAGNOSIS — R262 Difficulty in walking, not elsewhere classified: Secondary | ICD-10-CM | POA: Diagnosis not present

## 2023-11-17 DIAGNOSIS — M1611 Unilateral primary osteoarthritis, right hip: Secondary | ICD-10-CM | POA: Diagnosis not present

## 2023-11-17 DIAGNOSIS — M25551 Pain in right hip: Secondary | ICD-10-CM | POA: Diagnosis not present

## 2023-11-24 DIAGNOSIS — M25551 Pain in right hip: Secondary | ICD-10-CM | POA: Diagnosis not present

## 2023-11-24 DIAGNOSIS — R262 Difficulty in walking, not elsewhere classified: Secondary | ICD-10-CM | POA: Diagnosis not present

## 2023-11-24 DIAGNOSIS — M1611 Unilateral primary osteoarthritis, right hip: Secondary | ICD-10-CM | POA: Diagnosis not present

## 2023-12-01 DIAGNOSIS — R262 Difficulty in walking, not elsewhere classified: Secondary | ICD-10-CM | POA: Diagnosis not present

## 2023-12-01 DIAGNOSIS — M25551 Pain in right hip: Secondary | ICD-10-CM | POA: Diagnosis not present

## 2023-12-01 DIAGNOSIS — M1611 Unilateral primary osteoarthritis, right hip: Secondary | ICD-10-CM | POA: Diagnosis not present

## 2023-12-05 DIAGNOSIS — M1611 Unilateral primary osteoarthritis, right hip: Secondary | ICD-10-CM | POA: Diagnosis not present

## 2023-12-05 DIAGNOSIS — M25551 Pain in right hip: Secondary | ICD-10-CM | POA: Diagnosis not present

## 2023-12-05 DIAGNOSIS — R262 Difficulty in walking, not elsewhere classified: Secondary | ICD-10-CM | POA: Diagnosis not present

## 2023-12-16 DIAGNOSIS — M1611 Unilateral primary osteoarthritis, right hip: Secondary | ICD-10-CM | POA: Diagnosis not present

## 2023-12-16 DIAGNOSIS — R262 Difficulty in walking, not elsewhere classified: Secondary | ICD-10-CM | POA: Diagnosis not present

## 2023-12-16 DIAGNOSIS — M25551 Pain in right hip: Secondary | ICD-10-CM | POA: Diagnosis not present

## 2023-12-29 DIAGNOSIS — R262 Difficulty in walking, not elsewhere classified: Secondary | ICD-10-CM | POA: Diagnosis not present

## 2023-12-29 DIAGNOSIS — M25551 Pain in right hip: Secondary | ICD-10-CM | POA: Diagnosis not present

## 2023-12-29 DIAGNOSIS — M1611 Unilateral primary osteoarthritis, right hip: Secondary | ICD-10-CM | POA: Diagnosis not present

## 2024-01-09 DIAGNOSIS — M25551 Pain in right hip: Secondary | ICD-10-CM | POA: Diagnosis not present

## 2024-01-09 DIAGNOSIS — R262 Difficulty in walking, not elsewhere classified: Secondary | ICD-10-CM | POA: Diagnosis not present

## 2024-01-09 DIAGNOSIS — M1611 Unilateral primary osteoarthritis, right hip: Secondary | ICD-10-CM | POA: Diagnosis not present

## 2024-01-23 DIAGNOSIS — R262 Difficulty in walking, not elsewhere classified: Secondary | ICD-10-CM | POA: Diagnosis not present

## 2024-01-23 DIAGNOSIS — M25551 Pain in right hip: Secondary | ICD-10-CM | POA: Diagnosis not present

## 2024-01-23 DIAGNOSIS — M1611 Unilateral primary osteoarthritis, right hip: Secondary | ICD-10-CM | POA: Diagnosis not present

## 2024-02-06 DIAGNOSIS — M25551 Pain in right hip: Secondary | ICD-10-CM | POA: Diagnosis not present

## 2024-02-06 DIAGNOSIS — R262 Difficulty in walking, not elsewhere classified: Secondary | ICD-10-CM | POA: Diagnosis not present

## 2024-02-06 DIAGNOSIS — M1611 Unilateral primary osteoarthritis, right hip: Secondary | ICD-10-CM | POA: Diagnosis not present

## 2024-02-20 DIAGNOSIS — M1611 Unilateral primary osteoarthritis, right hip: Secondary | ICD-10-CM | POA: Diagnosis not present

## 2024-02-20 DIAGNOSIS — M25551 Pain in right hip: Secondary | ICD-10-CM | POA: Diagnosis not present

## 2024-02-20 DIAGNOSIS — R262 Difficulty in walking, not elsewhere classified: Secondary | ICD-10-CM | POA: Diagnosis not present

## 2024-02-21 DIAGNOSIS — H902 Conductive hearing loss, unspecified: Secondary | ICD-10-CM | POA: Diagnosis not present

## 2024-02-21 DIAGNOSIS — H6123 Impacted cerumen, bilateral: Secondary | ICD-10-CM | POA: Diagnosis not present

## 2024-03-05 DIAGNOSIS — R262 Difficulty in walking, not elsewhere classified: Secondary | ICD-10-CM | POA: Diagnosis not present

## 2024-03-05 DIAGNOSIS — M25551 Pain in right hip: Secondary | ICD-10-CM | POA: Diagnosis not present

## 2024-03-05 DIAGNOSIS — M1611 Unilateral primary osteoarthritis, right hip: Secondary | ICD-10-CM | POA: Diagnosis not present

## 2024-03-19 DIAGNOSIS — M1611 Unilateral primary osteoarthritis, right hip: Secondary | ICD-10-CM | POA: Diagnosis not present

## 2024-03-19 DIAGNOSIS — R262 Difficulty in walking, not elsewhere classified: Secondary | ICD-10-CM | POA: Diagnosis not present

## 2024-03-19 DIAGNOSIS — M25551 Pain in right hip: Secondary | ICD-10-CM | POA: Diagnosis not present

## 2024-04-02 DIAGNOSIS — R262 Difficulty in walking, not elsewhere classified: Secondary | ICD-10-CM | POA: Diagnosis not present

## 2024-04-02 DIAGNOSIS — M1611 Unilateral primary osteoarthritis, right hip: Secondary | ICD-10-CM | POA: Diagnosis not present

## 2024-04-02 DIAGNOSIS — M25551 Pain in right hip: Secondary | ICD-10-CM | POA: Diagnosis not present

## 2024-04-09 ENCOUNTER — Ambulatory Visit: Admitting: Orthopaedic Surgery

## 2024-04-09 VITALS — Wt 285.0 lb

## 2024-04-09 DIAGNOSIS — M1611 Unilateral primary osteoarthritis, right hip: Secondary | ICD-10-CM

## 2024-04-09 DIAGNOSIS — M25551 Pain in right hip: Secondary | ICD-10-CM

## 2024-04-09 NOTE — Progress Notes (Signed)
 Riley Peterson is a very pleasant 68 year old gentleman that I have seen a year ago as a relates to known arthritis of his right hip.  He actually turns 68 at the end of the week and does have retirement soon.  He wanted to wait until after his retirement to proceed with hip replacement surgery.  He has been on a weight loss journey.  He still would like to lose more weight and he is going to have a discussion soon with his primary care physician about GLP-1 medications.  He is not a diabetic.  His BMI today is 38.39.  He does report daily right hip pain and even his x-rays a year ago showed bone-on-bone arthritis with joint space narrowing and cystic changes in the femoral head consistent with osteoarthritis.  At this point at times his right hip pain definitely affects his mobility, his quality of life and his actives a day living.  I was able to review his past medical history and medications with him as well.  He does have a lot of appropriate questions as it relates to hip replacement surgery.  He does have a hand about hip replacement surgery.  He asked about the implants that we use and we talked about sizes and I did even show him examples of the acetabular component, the polyethylene liner, the femoral head and the femoral component.  He understands that I use a DePuy J&J product with an Actis femoral component, a Sectra GRIPTION acetabular component and usually a ceramic head with a polyliner.  On exam his right hip still has pretty good range of motion but it definitely hurts with internal and external rotation consistent with his osteoarthritis.  He has been through some physical therapy as well and that has been very helpful with him staying mobile and limber.  We did discuss surgery again in detail.  He is considering having the surgery scheduled for January of this upcoming year which is 4 months away.  With that being said we did talk about going ahead and setting him up for an intra-articular steroid  injection in his right hip joint under fluoroscopy.  Will order this with Dr. Eldonna to perform.  He also has our surgery scheduler's card.  We will go ahead and temporarily work on getting him scheduled for a right total hip arthroplasty in January of this coming year and we will be in touch.  All questions concerns were addressed and answered.

## 2024-04-10 ENCOUNTER — Other Ambulatory Visit: Payer: Self-pay

## 2024-04-10 DIAGNOSIS — M1611 Unilateral primary osteoarthritis, right hip: Secondary | ICD-10-CM

## 2024-04-10 DIAGNOSIS — M25551 Pain in right hip: Secondary | ICD-10-CM

## 2024-04-18 ENCOUNTER — Ambulatory Visit: Admitting: Nurse Practitioner

## 2024-04-18 VITALS — BP 122/78 | HR 77 | Temp 98.1°F | Ht 72.25 in | Wt 286.0 lb

## 2024-04-18 DIAGNOSIS — M25551 Pain in right hip: Secondary | ICD-10-CM | POA: Insufficient documentation

## 2024-04-18 NOTE — Patient Instructions (Signed)
 Call and see if Zepbound or [facility] are covered  [facility]  Address: [address] [place], [place], [place] [address] Hours:  Closes soon ? 5?PM ? Opens 7?AM Tue Confirmed by this business 7 weeks ago Phone: [phone]

## 2024-04-18 NOTE — Progress Notes (Signed)
 Established Patient Office Visit  Subjective   Patient ID: Riley Peterson, male    DOB: 05-Feb-1956  Age: 68 y.o. MRN: [id number]  Chief Complaint  Patient presents with   Follow-up    Weight re check     HPI  Discussed the use of AI scribe software for clinical note transcription with the patient, who gave verbal consent to proceed.  History of Present Illness Riley Peterson is a 68 year old male who presents with hip pain and weight management concerns.  He is experiencing hip pain and is consulting for weight management to aid in his upcoming hip surgery. He has been struggling with weight fluctuations since 2008 when he lost 120 pounds but has since regained some weight. He is currently working on weight loss and plans to retire at the end of the year.  He has been experiencing incontinence following a prostate procedure, which he did not anticipate. This has been a significant concern for him, especially in relation to his weight management and hip condition.  He has been actively working on his weight loss since September 1st, having lost 18 pounds. He is utilizing his [place] seismologist through [employer] to work out at [employer] daily. His exercise regimen includes rowing and stationary biking, as well as weightlifting, despite needing to walk slowly and use a walking stick due to his hip condition. He is also working with a physical therapist who recommended these exercises.  His current diet is part of a program from [employer], which he follows strictly. It includes specific portions of proteins, fruits, and vegetables, with plans to reintroduce whole grains after 30 days. He previously lost significant weight on this program but is now facing challenges due to age and physical limitations.  He is concerned about the excess skin and 'apron' effect from weight loss, which he finds distressing. He is also worried about the impact of his incontinence on  this issue. He is considering options for weight loss medications but is concerned about the cost and insurance coverage.  No family history of medullary thyroid cancer or multiple endocrine neoplasia syndrome type two. He is not aware of having a high A1c and has not had it checked recently.     Review of Systems  Constitutional:  Negative for chills and fever.  Respiratory:  Negative for shortness of breath.   Cardiovascular:  Negative for chest pain.  Musculoskeletal:  Positive for joint pain.      Objective:     BP 122/78   Pulse 77   Temp 98.1 F (36.7 C) (Oral)   Ht 6' 0.25 (1.835 m)   Wt 286 lb (129.7 kg)   SpO2 96%   BMI 38.52 kg/m  BP Readings from Last 3 Encounters:  04/18/24 122/78  10/05/23 (!) 158/83  07/13/23 118/80   Wt Readings from Last 3 Encounters:  04/18/24 286 lb (129.7 kg)  04/09/24 285 lb (129.3 kg)  07/13/23 280 lb (127 kg)   SpO2 Readings from Last 3 Encounters:  04/18/24 96%  04/07/23 96%  02/04/23 100%      Physical Exam Vitals and nursing note reviewed.  Constitutional:      Appearance: Normal appearance.  Cardiovascular:     Rate and Rhythm: Normal rate and regular rhythm.     Heart sounds: Normal heart sounds.  Pulmonary:     Effort: Pulmonary effort is normal.     Breath sounds: Normal breath sounds.  Abdominal:  General: Bowel sounds are normal.  Neurological:     Mental Status: He is alert.      No results found for any visits on 04/18/24.    The 10-year ASCVD risk score (Arnett DK, et al., 2019) is: 10.4%    Assessment & Plan:   Problem List Items Addressed This Visit       Other   MORBID OBESITY   Right hip pain - Primary   Assessment and Plan Assessment & Plan Obesity Obesity with significant weight loss. Considering GLP-1 receptor agonists for further weight reduction. - Check insurance coverage for Zepbound and Wegovy. - Recommend Weight and Wellness clinic visit for caloric and biometric  assessment. - Continue current diet and exercise regimen. - Discuss pharmacotherapy options if covered by insurance.  Excess abdominal skin (pannus) Excess skin causing discomfort and potential skin issues. Incontinence may worsen skin problems. - Consider panniculectomy if significant skin breakdown occurs.  Right hip osteoarthritis Chronic osteoarthritis affecting mobility. Weight loss may alleviate symptoms. Hip replacement planned for January. - Continue physical therapy and low-impact exercises. - Plan for hip replacement surgery in January.  Urinary incontinence Incontinence post-prostate procedure.  Return for As scheduled .    Adina Crandall, NP

## 2024-04-20 ENCOUNTER — Ambulatory Visit: Admitting: Physical Medicine and Rehabilitation

## 2024-04-23 ENCOUNTER — Other Ambulatory Visit: Payer: Self-pay

## 2024-04-23 ENCOUNTER — Ambulatory Visit: Admitting: Physical Medicine and Rehabilitation

## 2024-04-23 DIAGNOSIS — M25551 Pain in right hip: Secondary | ICD-10-CM

## 2024-04-23 MED ORDER — BUPIVACAINE HCL 0.25 % IJ SOLN
4.0000 mL | INTRAMUSCULAR | Status: AC | PRN
  Administered 2024-04-23: 4 mL via INTRA_ARTICULAR

## 2024-04-23 MED ORDER — TRIAMCINOLONE ACETONIDE 40 MG/ML IJ SUSP
40.0000 mg | INTRAMUSCULAR | Status: AC | PRN
  Administered 2024-04-23: 40 mg via INTRA_ARTICULAR

## 2024-04-23 NOTE — Progress Notes (Signed)
 Riley Peterson - 68 y.o. male MRN [id number]  Date of birth: [birth year]  Office Visit Note: Visit Date: 04/23/2024 PCP: Wendee Lynwood HERO, NP Referred by: Velma Raisin, MD  Subjective: Chief Complaint  Patient presents with   Right Hip - Pain   HPI:  Riley Peterson is a 68 y.o. male who comes in today at the request of Dr. Lonni Poli for planned Right anesthetic hip arthrogram with fluoroscopic guidance.  The patient has failed conservative care including home exercise, medications, time and activity modification.  This injection will be diagnostic and hopefully therapeutic.  Please see requesting physician notes for further details and justification.    ROS Otherwise per HPI.  Assessment & Plan: Visit Diagnoses:    ICD-10-CM   1. Pain of right hip  M25.551 Large Joint Inj: R hip joint    XR C-ARM NO REPORT      Plan: No additional findings.   Meds & Orders: No orders of the defined types were placed in this encounter.   Orders Placed This Encounter  Procedures   Large Joint Inj: R hip joint   XR C-ARM NO REPORT    Follow-up: Return for visit to requesting provider as needed.   Procedures: Large Joint Inj: R hip joint on 04/23/2024 8:57 AM Indications: diagnostic evaluation and pain Details: 22 G 3.5 in needle, fluoroscopy-guided anterior approach  Arthrogram: No  Medications: 4 mL bupivacaine 0.25 %; 40 mg triamcinolone acetonide 40 MG/ML Outcome: tolerated well, no immediate complications  There was excellent flow of contrast producing a partial arthrogram of the hip. The patient did have relief of symptoms during the anesthetic phase of the injection. Procedure, treatment alternatives, risks and benefits explained, specific risks discussed. Consent was given by the patient. Immediately prior to procedure a time out was called to verify the correct patient, procedure, equipment, support staff and site/side marked as required. Patient was prepped and draped in  the usual sterile fashion.          Clinical History: No specialty comments available.     Objective:  VS:  HT:    WT:   BMI:     BP:   HR: bpm  TEMP: ( )  RESP:  Physical Exam Vitals and nursing note reviewed.  Constitutional:      General: He is not in acute distress.    Appearance: Normal appearance. He is obese. He is not ill-appearing.  HENT:     Head: Normocephalic and atraumatic.     Right Ear: External ear normal.     Left Ear: External ear normal.     Nose: No congestion.  Eyes:     Extraocular Movements: Extraocular movements intact.  Cardiovascular:     Rate and Rhythm: Normal rate.     Pulses: Normal pulses.  Pulmonary:     Effort: Pulmonary effort is normal. No respiratory distress.  Abdominal:     General: There is no distension.     Palpations: Abdomen is soft.  Musculoskeletal:        General: Tenderness present. No signs of injury.     Cervical back: Neck supple.     Right lower leg: No edema.     Left lower leg: No edema.     Comments: Patient has good distal strength without clonus.  Skin:    Findings: No erythema or rash.  Neurological:     General: No focal deficit present.     Mental Status: He is alert and oriented  to person, place, and time.     Cranial Nerves: No cranial nerve deficit.     Sensory: No sensory deficit.     Motor: No weakness or abnormal muscle tone.     Coordination: Coordination normal.     Gait: Gait abnormal.  Psychiatric:        Mood and Affect: Mood normal.        Behavior: Behavior normal.      Imaging: No results found.

## 2024-04-23 NOTE — Progress Notes (Signed)
 Pain Scale   Average Pain 1 Patient advising he has chronic right hip pain that increases with daily movement.        +Driver, -BT, -Dye Allergies.

## 2024-04-24 ENCOUNTER — Telehealth: Payer: Self-pay

## 2024-04-24 NOTE — Telephone Encounter (Signed)
 I called patient to discuss scheduling THA.  Left voice mail message for return call.

## 2024-05-28 ENCOUNTER — Encounter: Payer: Self-pay | Admitting: Radiology

## 2024-07-12 ENCOUNTER — Ambulatory Visit

## 2024-07-13 ENCOUNTER — Encounter: Payer: Medicare PPO | Admitting: Nurse Practitioner

## 2024-07-23 ENCOUNTER — Encounter: Payer: Self-pay | Admitting: Nurse Practitioner

## 2024-07-23 ENCOUNTER — Ambulatory Visit: Admitting: Nurse Practitioner

## 2024-07-23 VITALS — BP 138/82 | HR 65 | Temp 97.9°F | Ht 72.0 in | Wt 267.4 lb

## 2024-07-23 DIAGNOSIS — M25551 Pain in right hip: Secondary | ICD-10-CM | POA: Diagnosis not present

## 2024-07-23 DIAGNOSIS — E785 Hyperlipidemia, unspecified: Secondary | ICD-10-CM | POA: Diagnosis not present

## 2024-07-23 DIAGNOSIS — N400 Enlarged prostate without lower urinary tract symptoms: Secondary | ICD-10-CM

## 2024-07-23 DIAGNOSIS — M150 Primary generalized (osteo)arthritis: Secondary | ICD-10-CM

## 2024-07-23 DIAGNOSIS — Z23 Encounter for immunization: Secondary | ICD-10-CM

## 2024-07-23 DIAGNOSIS — Z0000 Encounter for general adult medical examination without abnormal findings: Secondary | ICD-10-CM | POA: Diagnosis not present

## 2024-07-23 DIAGNOSIS — Z125 Encounter for screening for malignant neoplasm of prostate: Secondary | ICD-10-CM

## 2024-07-23 LAB — CBC WITH DIFFERENTIAL/PLATELET
Basophils Absolute: 0.1 K/uL (ref 0.0–0.1)
Basophils Relative: 1 % (ref 0.0–3.0)
Eosinophils Absolute: 0.7 K/uL (ref 0.0–0.7)
Eosinophils Relative: 12.2 % — ABNORMAL HIGH (ref 0.0–5.0)
HCT: 39.8 % (ref 39.0–52.0)
Hemoglobin: 13.5 g/dL (ref 13.0–17.0)
Lymphocytes Relative: 21.6 % (ref 12.0–46.0)
Lymphs Abs: 1.2 K/uL (ref 0.7–4.0)
MCHC: 33.9 g/dL (ref 30.0–36.0)
MCV: 85.5 fl (ref 78.0–100.0)
Monocytes Absolute: 0.6 K/uL (ref 0.1–1.0)
Monocytes Relative: 10.8 % (ref 3.0–12.0)
Neutro Abs: 3 K/uL (ref 1.4–7.7)
Neutrophils Relative %: 54.4 % (ref 43.0–77.0)
Platelets: 238 K/uL (ref 150.0–400.0)
RBC: 4.66 Mil/uL (ref 4.22–5.81)
RDW: 13.8 % (ref 11.5–15.5)
WBC: 5.6 K/uL (ref 4.0–10.5)

## 2024-07-23 LAB — COMPREHENSIVE METABOLIC PANEL WITH GFR
ALT: 29 U/L (ref 3–53)
AST: 29 U/L (ref 5–37)
Albumin: 4.1 g/dL (ref 3.5–5.2)
Alkaline Phosphatase: 66 U/L (ref 39–117)
BUN: 20 mg/dL (ref 6–23)
CO2: 29 meq/L (ref 19–32)
Calcium: 8.7 mg/dL (ref 8.4–10.5)
Chloride: 105 meq/L (ref 96–112)
Creatinine, Ser: 0.78 mg/dL (ref 0.40–1.50)
GFR: 93.08 mL/min
Glucose, Bld: 109 mg/dL — ABNORMAL HIGH (ref 70–99)
Potassium: 4.7 meq/L (ref 3.5–5.1)
Sodium: 140 meq/L (ref 135–145)
Total Bilirubin: 0.4 mg/dL (ref 0.2–1.2)
Total Protein: 6 g/dL (ref 6.0–8.3)

## 2024-07-23 LAB — LIPID PANEL
Cholesterol: 161 mg/dL (ref 28–200)
HDL: 50.2 mg/dL
LDL Cholesterol: 95 mg/dL (ref 10–99)
NonHDL: 110.8
Total CHOL/HDL Ratio: 3
Triglycerides: 81 mg/dL (ref 10.0–149.0)
VLDL: 16.2 mg/dL (ref 0.0–40.0)

## 2024-07-23 LAB — TSH: TSH: 4.2 u[IU]/mL (ref 0.35–5.50)

## 2024-07-23 LAB — PSA, MEDICARE: PSA: 0.79 ng/mL (ref 0.10–4.00)

## 2024-07-23 LAB — HEMOGLOBIN A1C: Hgb A1c MFr Bld: 5.6 % (ref 4.6–6.5)

## 2024-07-23 NOTE — Assessment & Plan Note (Signed)
 Currently doing a dietary program from [facility].  Patient does have comorbidities of hyperlipidemia and osteoarthritis.  He is exercising appropriate amounts of 45 minutes 5 times a week patient is continue to lose weight.  Continue working on lifestyle modifications continue losing weight

## 2024-07-23 NOTE — Progress Notes (Signed)
 "  Established Patient Office Visit  Subjective   Patient ID: Riley Peterson, male    DOB: 1956-06-10  Age: 68 y.o. MRN: [id number]  Chief Complaint  Patient presents with   Transitions Of Care    HPI  HLD: Patient currently maintained on atorvastatin  10 mg daily.states that he has been off the medication for several months. Since he is losing weight and eating better. He also wanted to see if it helped with some of his body aches.   for complete physical and follow up of chronic conditions.  Immunizations: -Tetanus: Completed in 2023 -Influenza: up date today  -Shingles: Completed Shingrix series -Pneumonia: Completed 24  Diet: Fair diet. He is doing 4oz of prtoein 3 times a day. He iwll do afruit in the am. 16oz of veggie or slad. He can do 1.5 veggies for salads. He is drinking  Exercise: he is exercising for 45 mins 5 times a week   Eye exam: otc readers Dental exam: Completes semi-annually.   Colonoscopy: Completed in 01/07/2021, repeat 5 years.  Patient due 2027 Lung Cancer Screening: N/A  PSA: Due. Baseline psa post procedure 0.3-0.4  Sleep: goes to bed around [phone] and then will get up aorund 2-3 in the am and will doze back off and get up for the day   Advanced directive: does not have one       Review of Systems  Constitutional:  Negative for chills and fever.  Respiratory:  Negative for shortness of breath.   Cardiovascular:  Negative for chest pain and leg swelling.  Gastrointestinal:  Negative for abdominal pain, blood in stool, constipation, diarrhea, nausea and vomiting.       Bm  daily   Genitourinary:  Negative for dysuria and hematuria.       Incontinence form prostate surgery   Neurological:  Negative for tingling and headaches.  Psychiatric/Behavioral:  Negative for hallucinations and suicidal ideas.       Objective:     BP 138/82   Pulse 65   Temp 97.9 F (36.6 C) (Oral)   Ht 6' (1.829 m)   Wt 267 lb 6.4 oz (121.3 kg)   SpO2 98%    BMI 36.27 kg/m  BP Readings from Last 3 Encounters:  07/23/24 138/82  04/18/24 122/78  10/05/23 (!) 158/83   Wt Readings from Last 3 Encounters:  07/23/24 267 lb 6.4 oz (121.3 kg)  04/18/24 286 lb (129.7 kg)  04/09/24 285 lb (129.3 kg)   SpO2 Readings from Last 3 Encounters:  07/23/24 98%  04/18/24 96%  04/07/23 96%      Physical Exam Vitals and nursing note reviewed.  Constitutional:      Appearance: Normal appearance.  HENT:     Right Ear: Tympanic membrane, ear canal and external ear normal.     Left Ear: Tympanic membrane, ear canal and external ear normal.     Mouth/Throat:     Mouth: Mucous membranes are moist.     Pharynx: Oropharynx is clear.  Eyes:     Extraocular Movements: Extraocular movements intact.     Pupils: Pupils are equal, round, and reactive to light.  Cardiovascular:     Rate and Rhythm: Normal rate and regular rhythm.     Pulses: Normal pulses.     Heart sounds: Normal heart sounds.  Pulmonary:     Effort: Pulmonary effort is normal.     Breath sounds: Normal breath sounds.  Abdominal:     General: Bowel sounds are normal.  There is no distension.     Palpations: There is no mass.     Tenderness: There is no abdominal tenderness.     Hernia: No hernia is present.  Musculoskeletal:     Right lower leg: No edema.     Left lower leg: No edema.  Lymphadenopathy:     Cervical: No cervical adenopathy.  Skin:    General: Skin is warm.  Neurological:     General: No focal deficit present.     Mental Status: He is alert.     Deep Tendon Reflexes:     Reflex Scores:      Bicep reflexes are 2+ on the right side and 2+ on the left side.      Patellar reflexes are 2+ on the right side and 2+ on the left side.    Comments: Bilateral upper and lower extremity strength 5/5  Psychiatric:        Mood and Affect: Mood normal.        Behavior: Behavior normal.        Thought Content: Thought content normal.        Judgment: Judgment normal.       No results found for any visits on 07/23/24.    The 10-year ASCVD risk score (Arnett DK, et al., 2019) is: 12.8%    Assessment & Plan:   Problem List Items Addressed This Visit       Musculoskeletal and Integument   Osteoarthritis, multiple sites   History of the same.  Patient is working on lifestyle modifications to lose weight.  Does have a hip replacement scheduled but is thinking about pushing it back.  He will continue following with Dr. Vernetta orthopedist        Genitourinary   BPH (benign prostatic hyperplasia)   History of same status post prostate surgery.  Patient has incontinence after surgery.  States that his baseline PSA is 0.3-0.4.  Pending PSA today      Relevant Orders   PSA, Medicare     Other   MORBID OBESITY   Currently doing a dietary program from comp[facility].  Patient does have comorbidities of hyperlipidemia and osteoarthritis.  He is exercising appropriate amounts of 45 minutes 5 times a week patient is continue to lose weight.  Continue working on lifestyle modifications continue losing weight      HLD (hyperlipidemia)   History of the same previously on atorvastatin  10 mg daily.  Patient has self discontinued pending lipid panel today.  This was used for primary prevention      Relevant Orders   Hemoglobin A1c   Lipid panel   Preventative health care - Primary   Discussed age-appropriate immunizations and screening exams.  Did review patient's personal, surgical, social, family histories.  Patient up-to-date on all age-appropriate vaccinations he would like.  Update flu vaccine today.  Patient is up-to-date on CRC screening.  PSA today for prostate cancer screening.  Patient was given information at discharge about preventative healthcare maintenance with anticipatory guidance.      Relevant Orders   CBC with Differential/Platelet   Comprehensive metabolic panel with GFR   TSH   Lipid panel   Right hip pain    History of same followed orthopedists scheduled undergo total right hip replacement      Other Visit Diagnoses       Screening for prostate cancer       Relevant Orders   PSA, Medicare  Need for influenza vaccination       Relevant Orders   Flu vaccine HIGH DOSE PF(Fluzone Trivalent) (Completed)       Return in about 1 year (around 07/23/2025) for CPE and Labs.    Adina Crandall, NP  "

## 2024-07-23 NOTE — Patient Instructions (Signed)
Nice to see you today I will be in touch with the labs once I have them Follow up with me in 1 year, sooner if you need me We did update your flu vaccine today

## 2024-07-23 NOTE — Assessment & Plan Note (Signed)
 History of same followed orthopedists scheduled undergo total right hip replacement

## 2024-07-23 NOTE — Assessment & Plan Note (Signed)
 History of the same previously on atorvastatin  10 mg daily.  Patient has self discontinued pending lipid panel today.  This was used for primary prevention

## 2024-07-23 NOTE — Assessment & Plan Note (Signed)
 History of same status post prostate surgery.  Patient has incontinence after surgery.  States that his baseline PSA is 0.3-0.4.  Pending PSA today

## 2024-07-23 NOTE — Assessment & Plan Note (Signed)
 Discussed age-appropriate immunizations and screening exams.  Did review patient's personal, surgical, social, family histories.  Patient up-to-date on all age-appropriate vaccinations he would like.  Update flu vaccine today.  Patient is up-to-date on CRC screening.  PSA today for prostate cancer screening.  Patient was given information at discharge about preventative healthcare maintenance with anticipatory guidance.

## 2024-07-23 NOTE — Assessment & Plan Note (Signed)
 History of the same.  Patient is working on lifestyle modifications to lose weight.  Does have a hip replacement scheduled but is thinking about pushing it back.  He will continue following with Dr. Vernetta orthopedist

## 2024-07-24 ENCOUNTER — Telehealth: Payer: Self-pay

## 2024-07-24 ENCOUNTER — Ambulatory Visit: Payer: Self-pay | Admitting: Nurse Practitioner

## 2024-07-24 NOTE — Telephone Encounter (Signed)
 Call from patient stating he wants to postpone his right THA scheduled for 08/14/24.  He will probably have done next fall.  He is feeling okay right now and wants to lose more weight.  He will call back to see Dr. Vernetta and then reschedule.

## 2024-08-01 ENCOUNTER — Encounter: Admitting: Orthopaedic Surgery

## 2024-08-14 ENCOUNTER — Ambulatory Visit (HOSPITAL_COMMUNITY): Admit: 2024-08-14 | Admitting: Orthopaedic Surgery

## 2024-08-14 SURGERY — ARTHROPLASTY, HIP, TOTAL, ANTERIOR APPROACH
Anesthesia: Spinal | Site: Hip | Laterality: Right

## 2024-08-27 ENCOUNTER — Encounter: Admitting: Orthopaedic Surgery

## 2024-10-02 ENCOUNTER — Ambulatory Visit

## 2025-08-05 ENCOUNTER — Encounter: Admitting: Nurse Practitioner
